# Patient Record
Sex: Female | Born: 1945 | Race: Black or African American | Hispanic: No | Smoking: Former smoker
Health system: Southern US, Community
[De-identification: ages and names within clinical notes are randomized; demographics above are authoritative.]

## PROBLEM LIST (undated history)

## (undated) DIAGNOSIS — J349 Unspecified disorder of nose and nasal sinuses: Secondary | ICD-10-CM

## (undated) DIAGNOSIS — K829 Disease of gallbladder, unspecified: Secondary | ICD-10-CM

## (undated) DIAGNOSIS — E119 Type 2 diabetes mellitus without complications: Secondary | ICD-10-CM

## (undated) DIAGNOSIS — R05 Cough: Secondary | ICD-10-CM

## (undated) DIAGNOSIS — R059 Cough, unspecified: Secondary | ICD-10-CM

## (undated) DIAGNOSIS — K219 Gastro-esophageal reflux disease without esophagitis: Secondary | ICD-10-CM

## (undated) DIAGNOSIS — R609 Edema, unspecified: Secondary | ICD-10-CM

## (undated) DIAGNOSIS — R197 Diarrhea, unspecified: Secondary | ICD-10-CM

## (undated) DIAGNOSIS — J45909 Unspecified asthma, uncomplicated: Secondary | ICD-10-CM

## (undated) DIAGNOSIS — M1990 Unspecified osteoarthritis, unspecified site: Secondary | ICD-10-CM

## (undated) DIAGNOSIS — I10 Essential (primary) hypertension: Secondary | ICD-10-CM

## (undated) DIAGNOSIS — M5134 Other intervertebral disc degeneration, thoracic region: Secondary | ICD-10-CM

## (undated) HISTORY — DX: Type 2 diabetes mellitus without complications: E11.9

## (undated) HISTORY — DX: Unspecified disorder of nose and nasal sinuses: J34.9

## (undated) HISTORY — DX: Disease of gallbladder, unspecified: K82.9

## (undated) HISTORY — PX: KNEE SURGERY: SHX244

## (undated) HISTORY — DX: Unspecified osteoarthritis, unspecified site: M19.90

## (undated) HISTORY — DX: Essential (primary) hypertension: I10

## (undated) HISTORY — PX: TOTAL ABDOMINAL HYSTERECTOMY: SHX209

## (undated) HISTORY — DX: Unspecified asthma, uncomplicated: J45.909

## (undated) HISTORY — DX: Gastro-esophageal reflux disease without esophagitis: K21.9

## (undated) HISTORY — PX: BACK SURGERY: SHX140

## (undated) HISTORY — DX: Edema, unspecified: R60.9

## (undated) HISTORY — DX: Diarrhea, unspecified: R19.7

## (undated) HISTORY — PX: SHOULDER SURGERY: SHX246

## (undated) HISTORY — DX: Cough: R05

## (undated) HISTORY — PX: OVARY SURGERY: SHX727

## (undated) HISTORY — PX: HAND SURGERY: SHX662

## (undated) HISTORY — DX: Other intervertebral disc degeneration, thoracic region: M51.34

## (undated) HISTORY — DX: Cough, unspecified: R05.9

## (undated) HISTORY — PX: FOOT SURGERY: SHX648

---
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
## 1970-10-08 HISTORY — PX: APPENDECTOMY: SHX54

## 1998-03-08 ENCOUNTER — Ambulatory Visit (HOSPITAL_COMMUNITY): Admission: RE | Admit: 1998-03-08 | Discharge: 1998-03-08 | Payer: Self-pay | Admitting: Neurosurgery

## 2002-10-08 HISTORY — PX: ANTERIOR CRUCIATE LIGAMENT REPAIR: SHX115

## 2004-10-08 HISTORY — PX: OTHER SURGICAL HISTORY: SHX169

## 2008-04-15 ENCOUNTER — Inpatient Hospital Stay (HOSPITAL_COMMUNITY): Admission: AD | Admit: 2008-04-15 | Discharge: 2008-04-16 | Payer: Self-pay | Admitting: Orthopedic Surgery

## 2008-07-30 ENCOUNTER — Ambulatory Visit (HOSPITAL_COMMUNITY): Admission: RE | Admit: 2008-07-30 | Discharge: 2008-07-31 | Payer: Self-pay | Admitting: Orthopedic Surgery

## 2009-09-29 ENCOUNTER — Emergency Department (HOSPITAL_COMMUNITY): Admission: EM | Admit: 2009-09-29 | Discharge: 2009-09-30 | Payer: Self-pay | Admitting: Emergency Medicine

## 2010-05-26 ENCOUNTER — Inpatient Hospital Stay (HOSPITAL_COMMUNITY): Admission: RE | Admit: 2010-05-26 | Discharge: 2010-06-01 | Payer: Self-pay | Admitting: Neurosurgery

## 2010-10-20 HISTORY — PX: COLONOSCOPY: SHX174

## 2010-10-29 ENCOUNTER — Encounter: Payer: Self-pay | Admitting: Orthopedic Surgery

## 2010-12-22 LAB — CBC
Hemoglobin: 11.6 g/dL — ABNORMAL LOW (ref 12.0–15.0)
MCHC: 31.6 g/dL (ref 30.0–36.0)
RDW: 14.7 % (ref 11.5–15.5)

## 2010-12-22 LAB — GLUCOSE, CAPILLARY
Glucose-Capillary: 100 mg/dL — ABNORMAL HIGH (ref 70–99)
Glucose-Capillary: 109 mg/dL — ABNORMAL HIGH (ref 70–99)
Glucose-Capillary: 124 mg/dL — ABNORMAL HIGH (ref 70–99)
Glucose-Capillary: 133 mg/dL — ABNORMAL HIGH (ref 70–99)
Glucose-Capillary: 134 mg/dL — ABNORMAL HIGH (ref 70–99)
Glucose-Capillary: 142 mg/dL — ABNORMAL HIGH (ref 70–99)
Glucose-Capillary: 146 mg/dL — ABNORMAL HIGH (ref 70–99)
Glucose-Capillary: 151 mg/dL — ABNORMAL HIGH (ref 70–99)
Glucose-Capillary: 151 mg/dL — ABNORMAL HIGH (ref 70–99)
Glucose-Capillary: 162 mg/dL — ABNORMAL HIGH (ref 70–99)
Glucose-Capillary: 46 mg/dL — ABNORMAL LOW (ref 70–99)
Glucose-Capillary: 81 mg/dL (ref 70–99)
Glucose-Capillary: 88 mg/dL (ref 70–99)
Glucose-Capillary: 97 mg/dL (ref 70–99)
Glucose-Capillary: 97 mg/dL (ref 70–99)

## 2010-12-22 LAB — BASIC METABOLIC PANEL
Calcium: 9.6 mg/dL (ref 8.4–10.5)
GFR calc Af Amer: >60 mL/min (ref >60)
GFR calc non Af Amer: >60 mL/min (ref >60)
Glucose, Bld: 101 mg/dL — ABNORMAL HIGH (ref 70–99)
Sodium: 144 mEq/L (ref 135–145)

## 2010-12-22 LAB — SURGICAL PCR SCREEN
MRSA, PCR: NEGATIVE
Staphylococcus aureus: NEGATIVE

## 2011-01-08 LAB — RAPID URINE DRUG SCREEN, HOSP PERFORMED
Amphetamines: NOT DETECTED
Benzodiazepines: POSITIVE — AB
Cocaine: NOT DETECTED
Tetrahydrocannabinol: NOT DETECTED

## 2011-01-08 LAB — DIFFERENTIAL
Basophils Absolute: 0.2 10*3/uL — ABNORMAL HIGH (ref 0.0–0.1)
Basophils Relative: 2 % — ABNORMAL HIGH (ref 0–1)
Eosinophils Relative: 2 % (ref 0–5)
Lymphocytes Relative: 28 % (ref 12–46)
Neutro Abs: 5.3 10*3/uL (ref 1.7–7.7)

## 2011-01-08 LAB — BASIC METABOLIC PANEL
BUN: 11 mg/dL (ref 6–23)
Calcium: 9.1 mg/dL (ref 8.4–10.5)
GFR calc non Af Amer: >60 mL/min (ref >60)
Glucose, Bld: 70 mg/dL (ref 70–99)

## 2011-01-08 LAB — URINALYSIS, ROUTINE W REFLEX MICROSCOPIC
Ketones, ur: NEGATIVE mg/dL
Nitrite: NEGATIVE
Protein, ur: NEGATIVE mg/dL

## 2011-01-08 LAB — PROTIME-INR
INR: 1.05 (ref 0.00–1.49)
Prothrombin Time: 13.6 seconds (ref 11.6–15.2)

## 2011-01-08 LAB — CBC
HCT: 35.2 % — ABNORMAL LOW (ref 36.0–46.0)
MCHC: 32.6 g/dL (ref 30.0–36.0)
Platelets: 208 10*3/uL (ref 150–400)
RDW: 13.9 % (ref 11.5–15.5)

## 2011-01-08 LAB — HEPATIC FUNCTION PANEL
Alkaline Phosphatase: 83 U/L (ref 39–117)
Bilirubin, Direct: <0.1 mg/dL (ref 0.0–0.3)
Total Bilirubin: 0.6 mg/dL (ref 0.3–1.2)

## 2011-01-08 LAB — ACETAMINOPHEN LEVEL: Acetaminophen (Tylenol), Serum: <10 ug/mL — ABNORMAL LOW (ref 10–30)

## 2011-02-20 NOTE — Op Note (Signed)
NAMEMARA, Frances Newman             ACCOUNT NO.:  [id number]   MEDICAL RECORD NO.:  [id number]          PATIENT TYPE:  AMB   LOCATION:  DAY                          FACILITY:  [facility]   PHYSICIAN:  Marlowe Kays, M.D.  DATE OF BIRTH:  Dec 31, 1945   DATE OF PROCEDURE:  04/14/2008  DATE OF DISCHARGE:                               OPERATIVE REPORT   PREOPERATIVE DIAGNOSES:  1. 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  2. Advanced osteoarthritis acromioclavicular joint.  3. Full-thickness retracted rotator cuff tear, left shoulder.   POSTOPERATIVE DIAGNOSES:  1. 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  2. Advanced osteoarthritis acromioclavicular joint.  3. Full-thickness retracted rotator cuff tear, left shoulder.   OPERATION:  1. Left shoulder arthroscopy with debridement of labrum, synovitis and      biceps tendon.  2. Open distal clavicle resection.  3. Anterior acromionectomy with repair of extensor rotator cuff tear.   SURGEON:  Marlowe Kays, M.D.   ASSISTANT:  Mr. Idolina Primer, [place].   ANESTHESIA:  Preoperative interscalene block and general anesthesia.   DESCRIPTION OF PROCEDURE:  Prophylactic antibiotics, satisfactory  general anesthesia, beach-chair position on the Allen frame, the left  shoulder was prepped with DuraPrep and draped in a sterile field.  Timeout performed.  Posterior arthroscopic portal, and incision for  distal clavicle resection, as well as the rotator cuff tear was marked  out.  Through a posterior soft spot portal, I atraumatically entered the  glenohumeral joint and found a large amount of synovitis, significant  labral disruption and general disarray which made initial evaluation of  the joint difficult until I was able to flush it out.  After reviewing  the pathology, I then advanced the scope between the biceps and  subscapularis.  The biceps was partially torn and using a swishing stick  and making  an for anterior incision, I used another cannula to enter the  joint, followed by a 4.2 shaver, debriding down the labrum, biceps  tendon and much of the synovitis.  She had full-thickness wear of the  glenoid and significant wear of the humeral head.  After completing this  portion of procedure, we left the camera mechanism intact, but I removed  all fluid possible from the joint.  I then made my open incision,  exposing the [facility] joint and distal clavicle.  She had significant  deformity of the [facility] joint, and I measured out a 1.5 cm medial to it,  undermined the clavicle at this point.  I then made an osteotomy with a  micro saw.  I resected the torn portion with towel clip and cautery  technique.  There were no remaining spicules of the apparent clavicle,  which I covered with bone wax.  I then extended my incision distally and  splitting the fascia in line with the skin incision over the anterior  acromion.  I then undermined the anterior acromion and performed my  initial anterior acromionectomy.  The extensor rotator cuff tear which  was upside down V with about 5 cm of retraction was identified.  Fortunately, it was flexible.  There  was some lamination of it.  I was  able to use a #2 Ethibond which had a small enough needle that I was  able to reapproximate the lamination at the apex of the V beneath the  acromion after removing a small amount more bone for exposure.  I placed  two such sutures and then placed a four strand Striker anchor at the  greater tuberosity, and then used the forced arms to reapproximate the  rotator cuff tendon side-to-side, giving a nice stable repair with her  arm to the side.  I took pictures of both the tear and the repair.  The  wound was then irrigated with sterile saline.  Gelfoam was placed in the  distal clavicle resection site, and the wound was then closed in layers  with interrupted #1 Vicryl in the fascia and deep subcutaneous tissue 2-  0 Vicryl,  superficial subcutaneous tissue and to assure adequate wound  closure, small staples in the skin with the two portals closed with 4-0  nylon.  Betadine Adaptic dry sterile dressing and shoulder immobilizer  were applied.  She tolerated the procedure and was taken to the recovery  room in satisfactory condition with no known complications.           ______________________________  Marlowe Kays, M.D.     JA/MEDQ  D:  04/14/2008  T:  04/14/2008  Job:  [id number]

## 2011-02-20 NOTE — Op Note (Signed)
Frances Newman, Frances Newman             ACCOUNT NO.:  [id number]   MEDICAL RECORD NO.:  [id number]          PATIENT TYPE:  AMB   LOCATION:  DAY                          FACILITY:  [facility]   PHYSICIAN:  Marlowe Kays, M.D.  DATE OF BIRTH:  1946-08-17   DATE OF PROCEDURE:  07/30/2008  DATE OF DISCHARGE:                               OPERATIVE REPORT   PREOPERATIVE DIAGNOSIS:  Recurrent rotator cuff tear, left shoulder.   POSTOPERATIVE DIAGNOSIS:  Recurrent rotator cuff tear, left shoulder.   OPERATION:  Anterior acromionectomy and repair of recurrent rotator cuff  tear, left shoulder.   SURGEON:  Marlowe Kays, M.D.   ASSISTANT:  Nurse.   ANESTHESIA:  General and preceded by interscalene block.   PATHOLOGY AND JUSTIFICATION FOR PROCEDURE:  I repaired a significant  rotator cuff tear on April 14, 2008.  She was somewhat less than 12 weeks  post surgery which is the demarcation time we use for any major  strengthening or stress on the rotator cuff repair and because she was  comfortable she lifted a heavy water jug and had a sudden pain in the  shoulder leading to an MRI demonstrating a full-thickness rotator cuff  tear which on MRI measured 2.4 cm x 2.9.  Because of the pain and the  documentation of the tear she is here today for additional surgery.   PROCEDURE:  Prophylactic antibiotics.  Interscalene block by  anesthesiologist, beach-chair position on the Allen frame, left shoulder  girdle was prepped with DuraPrep, draped in sterile field.  Time-out  performed.  Ioban employed.  I went through the old surgical incision  and with cutting cautery dissected off the fascia over, the residual  anterior acromion and split the deltoid for a centimeter or so.  Joint  fluid immediately came forth confirming the tear.  The four strands of  the rotator cuff anchor previously used were immediately apparent and  these were cut and removed.  The size of her tear was exactly as  depicted on the  MRI with a vertical type posturing and a more prominent  and flexible posterior flap as opposed to an anterior flap.  The tear  went way up beneath the acromion.  Accordingly, I removed additional  acromion with a rongeur for both exposure and to ease impingement.  After assessing the pathology of the tear I once again used a four  stranded Stryker rotator cuff anchor, placing it laterally near the  greater tuberosity and then initially used two of the strands for one  tie adjacent to the greater tuberosity to bring the two flaps together  then followed this with the other two above this.  This stabilized the  tear laterally.  I then used the remainder of these strands with  individual side-to-side type sutures which brought the two flaps  together nicely way up beneath the anterior acromion.  She had a nice  stable repair with her arms at her side and I did not feel that she  needed a tissue graft.  The wound was irrigated with sterile saline.  The wound was closed with interrupted #1  Vicryl in the fascia over the  anterior acromion and in the incision at the deltoid.  A combination  of zero and 2-0 Vicryl in subcutaneous tissues and Steri-Strips on the  skin.  Dry sterile dressing and shoulder immobilizer applied.  She  tolerated the procedure well was taken to the recovery room in  satisfactory condition with no known complications and minimal blood  loss.           ______________________________  Marlowe Kays, M.D.     JA/MEDQ  D:  07/30/2008  T:  07/30/2008  Job:  [id number]

## 2011-07-05 LAB — HEMOGLOBIN AND HEMATOCRIT, BLOOD: HCT: 37.5

## 2011-07-05 LAB — BASIC METABOLIC PANEL
CO2: 30
Calcium: 9.8
Chloride: 102
Creatinine, Ser: 0.79
Glucose, Bld: 116 — ABNORMAL HIGH

## 2011-07-10 LAB — URINALYSIS, ROUTINE W REFLEX MICROSCOPIC
Bilirubin Urine: NEGATIVE
Glucose, UA: NEGATIVE
Ketones, ur: NEGATIVE
Nitrite: NEGATIVE
Protein, ur: NEGATIVE
Urobilinogen, UA: 0.2
pH: 6.5

## 2011-07-10 LAB — GLUCOSE, CAPILLARY
Glucose-Capillary: 68 — ABNORMAL LOW
Glucose-Capillary: 70
Glucose-Capillary: 79

## 2011-07-10 LAB — HEMOGLOBIN AND HEMATOCRIT, BLOOD
HCT: 34.7 — ABNORMAL LOW
Hemoglobin: 11.3 — ABNORMAL LOW

## 2011-07-10 LAB — BASIC METABOLIC PANEL
BUN: 6
CO2: 25
Chloride: 109
Creatinine, Ser: 0.61
Potassium: 4.1

## 2013-08-25 ENCOUNTER — Ambulatory Visit: Payer: Self-pay | Admitting: Podiatrist

## 2013-10-07 ENCOUNTER — Encounter: Payer: Self-pay | Admitting: Podiatrist

## 2013-10-08 HISTORY — PX: CHOLECYSTECTOMY: SHX55

## 2013-10-13 ENCOUNTER — Ambulatory Visit (INDEPENDENT_AMBULATORY_CARE_PROVIDER_SITE_OTHER): Payer: PRIVATE HEALTH INSURANCE | Admitting: Podiatrist

## 2013-10-13 ENCOUNTER — Encounter: Payer: Self-pay | Admitting: Podiatrist

## 2013-10-13 VITALS — BP 122/69 | HR 76 | Resp 18

## 2013-10-13 DIAGNOSIS — M79609 Pain in unspecified limb: Secondary | ICD-10-CM

## 2013-10-13 DIAGNOSIS — B351 Tinea unguium: Secondary | ICD-10-CM

## 2013-10-13 NOTE — Progress Notes (Signed)
  HPI:  Patient presents today for follow up of foot and nail care. Denies any new complaints today.  Objective:  Patients chart is reviewed.  Neurovascular status unchanged.  Patients nails are thickened, discolored, distrophic, friable and brittle with yellow-brown discoloration. Patient subjectively relates they are painful with shoes and with ambulation of bilateral feet. 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  The symptomatic toenails and corns were debrided through manual an mechanical means without complication.  Return appointment recommended at routine intervals of 3 months    Frances AschoffKathryn Hasna Newman, DPM

## 2013-10-13 NOTE — Patient Instructions (Signed)
GENERAL FOOT HEALTH INFORMATION:  Moisturize your feet regularly with a cream based lotion such as Cetaphil (Cream) or Eucerin (Cream)- usually available in a tub or crock type of container.  Avoid applying the cream to the toe interspaces themselves to reduce the risk of a fungal infection between the toes.  After showering or bathing be sure to dry well between your toes.    Avoid prolonged use of thin ballet type flats, or flip flops.  Everyday use of these shoes can actually cause foot problems or injuries.  Watch your toenails for any signs of infection including drainage, pus redness or swelling along the sides of the toenails.  Soak in epsom salt water and use antibiotic ointment (OTC) if you notice this start to occur.  If the redness does not resolve within 2-3 days, call for an appointment to be seen.

## 2013-12-08 ENCOUNTER — Encounter: Payer: Self-pay | Admitting: Podiatrist

## 2013-12-08 ENCOUNTER — Ambulatory Visit (INDEPENDENT_AMBULATORY_CARE_PROVIDER_SITE_OTHER): Payer: PRIVATE HEALTH INSURANCE | Admitting: Podiatrist

## 2013-12-08 VITALS — BP 90/59 | HR 95 | Resp 18

## 2013-12-08 DIAGNOSIS — L84 Corns and callosities: Secondary | ICD-10-CM

## 2013-12-08 DIAGNOSIS — M7150 Other bursitis, not elsewhere classified, unspecified site: Secondary | ICD-10-CM

## 2013-12-08 DIAGNOSIS — M2040 Other hammer toe(s) (acquired), unspecified foot: Secondary | ICD-10-CM

## 2013-12-08 NOTE — Progress Notes (Signed)
   Chief Complaint  Patient presents with  . Foot Problem    3rd toe on right foot     HPI: Patient is 68 y.o. female who presents today for pain on the right third toe. Patient states "I want her to look at this 3rd toe on my right foot and no burning and some throbbing going up leg and sore and tender and there is no draining and aches"  she denies any trauma or injury to the foot.     Physical Exam  GENERAL APPEARANCE: Alert, conversant. Appropriately groomed. No acute distress.  VASCULAR: Pedal pulses palpable and strong bilateral at 2/4 DP and PT.  Capillary refill time is immediate to all digits,  Proximal to distal cooling it warm to warm.  Digital hair growth is present bilateral  NEUROLOGIC: sensation is intact epicritically and protectively to 5.07 monofilament at 5/5 sites bilateral.  Light touch is intact bilateral, vibratory sensation intact bilateral, achilles tendon reflex is intact bilateral.  MUSCULOSKELETAL: Hammertoe contracture present third digit right. Mild contracture of the lesser digits is also noted that the third digit on the right is the most symptomatic and painful.  DERMATOLOGIC: skin color, texture, and turger are within normal limits.  Swelling at the third digit right is present consistent with her pain. She has a hard lesion which is hyperkeratotic in nature at the distal tip of the right third toe.  Assessment: Hammertoe contracture, corn, bursitis  Plan: Recommended an injection of a local steroid to help with the swelling was carried out under sterile technique utilizing dexamethasone and Marcaine plain without complication.. I also debrided the corn with a 15 blade without complication. Recommended a buttress pad for offloading. She'll be seen back when necessary  Kazuo Durnil P

## 2014-02-09 ENCOUNTER — Ambulatory Visit: Payer: PRIVATE HEALTH INSURANCE | Admitting: Podiatrist

## 2014-02-23 ENCOUNTER — Encounter: Payer: Self-pay | Admitting: Podiatrist

## 2014-02-23 ENCOUNTER — Ambulatory Visit (INDEPENDENT_AMBULATORY_CARE_PROVIDER_SITE_OTHER): Payer: PRIVATE HEALTH INSURANCE | Admitting: Podiatrist

## 2014-02-23 VITALS — BP 115/73 | HR 100 | Resp 18

## 2014-02-23 DIAGNOSIS — L84 Corns and callosities: Secondary | ICD-10-CM

## 2014-02-23 DIAGNOSIS — M2040 Other hammer toe(s) (acquired), unspecified foot: Secondary | ICD-10-CM

## 2014-02-23 DIAGNOSIS — B353 Tinea pedis: Secondary | ICD-10-CM

## 2014-02-23 DIAGNOSIS — M79609 Pain in unspecified limb: Secondary | ICD-10-CM

## 2014-02-23 DIAGNOSIS — B351 Tinea unguium: Secondary | ICD-10-CM

## 2014-02-23 MED ORDER — DICLOFENAC SODIUM 1 % TD GEL: 2.0000 g | Freq: Four times a day (QID) | TRANSDERMAL | Status: DC

## 2014-02-23 MED ORDER — DIAZEPAM 2 MG PO TABS: 2.0000 mg | ORAL_TABLET | Freq: Every evening | ORAL | Status: DC | PRN

## 2014-02-23 MED ORDER — NAFTIFINE HCL 2 % EX CREA: 1.0000 "application " | TOPICAL_CREAM | CUTANEOUS | Status: DC

## 2014-02-23 NOTE — Patient Instructions (Signed)
Use the voltaren gel (rx has been sent in-- if its too expensive, call and I can try and substitute it out)  I will call in a rx for the itching and valium for your night cramps-- try it for a week to see if it helps.

## 2014-02-23 NOTE — Progress Notes (Signed)
I need my toenails trimmed up and a corn on my 3rd toe on my left foot needs to be cut and I have pain on my right foot but I have not done anything to it   Objective:  VASCULAR: Pedal pulses palpable and strong bilateral at 2/4 DP and PT. Capillary refill time is immediate to all digits, Proximal to distal cooling it warm to warm. Digital hair growth is present bilateral  NEUROLOGIC: sensation is intact epicritically and protectively to 5.07 monofilament at 5/5 sites bilateral. Light touch is intact bilateral, vibratory sensation intact bilateral, achilles tendon reflex is intact bilateral.  MUSCULOSKELETAL: Hammertoe contracture present third digit right. Mild contracture of the lesser digits is also noted that the third digit on the right is the most symptomatic and painful.  Pain on the dorsal right midfoot is also present.   DERMATOLOGIC: skin color, texture, and turger are within normal limits. Swelling at the third digit right is present consistent with her pain. She has a hard lesion which is hyperkeratotic in nature at the distal tip of the right third toe. Digital nails 2-5 are symptomatic and painful bilateral. They are mycotic and thickened.    Assessment: Hammertoe contracture, corn, bursitis, mycotic toenails with pain  Plan:  Debridement of nails and lesions carried out.  Recommended naftin cream, voltaren gel and valium as she is experiencing cramping as well.  She will be seen back in 3 months for routine care.

## 2014-06-22 ENCOUNTER — Encounter: Payer: Self-pay | Admitting: Podiatrist

## 2014-06-22 ENCOUNTER — Ambulatory Visit (INDEPENDENT_AMBULATORY_CARE_PROVIDER_SITE_OTHER): Payer: PRIVATE HEALTH INSURANCE | Admitting: Podiatrist

## 2014-06-22 VITALS — BP 136/79 | HR 105 | Resp 18

## 2014-06-22 DIAGNOSIS — E1140 Type 2 diabetes mellitus with diabetic neuropathy, unspecified: Secondary | ICD-10-CM

## 2014-06-22 DIAGNOSIS — B351 Tinea unguium: Secondary | ICD-10-CM

## 2014-06-22 DIAGNOSIS — E1149 Type 2 diabetes mellitus with other diabetic neurological complication: Secondary | ICD-10-CM

## 2014-06-22 DIAGNOSIS — Q828 Other specified congenital malformations of skin: Secondary | ICD-10-CM

## 2014-06-22 DIAGNOSIS — M216X9 Other acquired deformities of unspecified foot: Secondary | ICD-10-CM

## 2014-06-22 DIAGNOSIS — M79676 Pain in unspecified toe(s): Principal | ICD-10-CM

## 2014-06-22 DIAGNOSIS — M79609 Pain in unspecified limb: Secondary | ICD-10-CM

## 2014-06-22 NOTE — Patient Instructions (Signed)
Diabetes and Foot Care Diabetes may cause you to have problems because of poor blood supply (circulation) to your feet and legs. This may cause the skin on your feet to become thinner, break easier, and heal more slowly. Your skin may become dry, and the skin may peel and crack. You may also have nerve damage in your legs and feet causing decreased feeling in them. You may not notice minor injuries to your feet that could lead to infections or more serious problems. Taking care of your feet is one of the most important things you can do for yourself.  HOME CARE INSTRUCTIONS  Wear shoes at all times, even in the house. Do not go barefoot. Bare feet are easily injured.  Check your feet daily for blisters, cuts, and redness. If you cannot see the bottom of your feet, use a mirror or ask someone for help.  Wash your feet with warm water (do not use hot water) and mild soap. Then pat your feet and the areas between your toes until they are completely dry. Do not soak your feet as this can dry your skin.  Apply a moisturizing lotion or petroleum jelly (that does not contain alcohol and is unscented) to the skin on your feet and to dry, brittle toenails. Do not apply lotion between your toes.  Trim your toenails straight across. Do not dig under them or around the cuticle. File the edges of your nails with an emery board or nail file.  Do not cut corns or calluses or try to remove them with medicine.  Wear clean socks or stockings every day. Make sure they are not too tight. Do not wear knee-high stockings since they may decrease blood flow to your legs.  Wear shoes that fit properly and have enough cushioning. To break in new shoes, wear them for just a few hours a day. This prevents you from injuring your feet. Always look in your shoes before you put them on to be sure there are no objects inside.  Do not cross your legs. This may decrease the blood flow to your feet.  If you find a minor scrape,  cut, or break in the skin on your feet, keep it and the skin around it clean and dry. These areas may be cleansed with mild soap and water. Do not cleanse the area with peroxide, alcohol, or iodine.  When you remove an adhesive bandage, be sure not to damage the skin around it.  If you have a wound, look at it several times a day to make sure it is healing.  Do not use heating pads or hot water bottles. They may burn your skin. If you have lost feeling in your feet or legs, you may not know it is happening until it is too late.  Make sure your health care provider performs a complete foot exam at least annually or more often if you have foot problems. Report any cuts, sores, or bruises to your health care provider immediately. SEEK MEDICAL CARE IF:   You have an injury that is not healing.  You have cuts or breaks in the skin.  You have an ingrown nail.  You notice redness on your legs or feet.  You feel burning or tingling in your legs or feet.  You have pain or cramps in your legs and feet.  Your legs or feet are numb.  Your feet always feel cold. SEEK IMMEDIATE MEDICAL CARE IF:   There is increasing redness,   swelling, or pain in or around a wound.  There is a red line that goes up your leg.  Pus is coming from a wound.  You develop a fever or as directed by your health care provider.  You notice a bad smell coming from an ulcer or wound. Document Released: 09/21/2000 Document Revised: 05/27/2013 Document Reviewed: 03/03/2013 Exit[employer] Patient Information 2015 [employer]. This information is not intended to replace advice given to you by your health care provider. Make sure you discuss any questions you have with your health care provider.  

## 2014-06-29 NOTE — Progress Notes (Signed)
Subjective:  Patient presents for continued diabetic foot and nail care. She relates painful toenails and calluses.  Objective:  VASCULAR: Pedal pulses palpable and strong bilateral at 2/4 DP and PT. Capillary refill time is immediate to all digits, Proximal to distal cooling it warm to warm. Digital hair growth is present bilateral  NEUROLOGIC: sensation is intact epicritically and protectively to 5.07 monofilament at 5/5 sites bilateral. Light touch is intact bilateral, vibratory sensation intact bilateral, achilles tendon reflex is intact bilateral.  MUSCULOSKELETAL: Hammertoe contracture present third digit right. Mild contracture of the lesser digits is also noted that the third digit on the right is the most symptomatic and painful. Pain on the dorsal right midfoot is also present.  DERMATOLOGIC: skin color, texture, and turger are within normal limits. Swelling at the third digit right is present consistent with her pain. She has a hard lesion which is hyperkeratotic in nature at the distal tip of the right third toe. Digital nails 2-5 are symptomatic and painful bilateral. They are mycotic and thickened.   Assessment: Hammertoe contracture, corn, bursitis, mycotic toenails with pain  Plan: Debridement of nails and lesions carried out.  She will be seen back in 3 months for routine care

## 2014-08-10 ENCOUNTER — Ambulatory Visit (INDEPENDENT_AMBULATORY_CARE_PROVIDER_SITE_OTHER): Payer: PRIVATE HEALTH INSURANCE | Admitting: Podiatrist

## 2014-08-10 ENCOUNTER — Ambulatory Visit (INDEPENDENT_AMBULATORY_CARE_PROVIDER_SITE_OTHER): Payer: PRIVATE HEALTH INSURANCE

## 2014-08-10 ENCOUNTER — Encounter: Payer: Self-pay | Admitting: Podiatrist

## 2014-08-10 VITALS — BP 128/88 | HR 64 | Resp 12

## 2014-08-10 DIAGNOSIS — M84374A Stress fracture, right foot, initial encounter for fracture: Secondary | ICD-10-CM

## 2014-08-10 DIAGNOSIS — M6588 Other synovitis and tenosynovitis, other site: Secondary | ICD-10-CM

## 2014-08-10 DIAGNOSIS — M7750 Other enthesopathy of unspecified foot: Secondary | ICD-10-CM

## 2014-08-10 MED ORDER — TRIAMCINOLONE ACETONIDE 10 MG/ML IJ SUSP
10.0000 mg | Freq: Once | INTRAMUSCULAR | Status: AC
  Administered 2014-08-10: 10 mg

## 2014-08-10 NOTE — Progress Notes (Signed)
   Subjective: Frances Newman presents today for continued pain in the right foot. She points to the dorsal central and metatarsal heads 2, 3, 4 and states she's having pain. She's been trying to wear a compressive copper sock and relates no improvement.  Objective:  Neurovascular status is intact and unchanged the right foot. MUSCULOSKELETAL: Pain on the dorsal central aspect of the right foot and at the metatarsal heads 2, 3, 4 are noted.Hammertoe contracture present third digit right. Mild contracture of the lesser digits is also noted that the third the tarsalon the right is the most symptomatic and painful.   X-rays taken today which show arthritic changes. Hammertoe contractures also present. No sign of fracture or stress fracture  Assessment: Arthritis right foot  Plan: Injected the midfoot area with Kenalog and Marcaine plain at the area of most discomfort. Recommended continued compression and anti-inflammatory medications. Be seen back for her routine care appointment and if has any problems prior to that visit she will call

## 2014-08-10 NOTE — Patient Instructions (Signed)
Arthritis, Nonspecific °Arthritis is inflammation of a joint. This usually means pain, redness, warmth or swelling are present. One or more joints may be involved. There are a number of types of arthritis. Your caregiver may not be able to tell what type of arthritis you have right away. °CAUSES  °The most common cause of arthritis is the wear and tear on the joint (osteoarthritis). This causes damage to the cartilage, which can break down over time. The knees, hips, back and neck are most often affected by this type of arthritis. °Other types of arthritis and common causes of joint pain include: °· Sprains and other injuries near the joint. Sometimes minor sprains and injuries cause pain and swelling that develop hours later. °· Rheumatoid arthritis. This affects hands, feet and knees. It usually affects both sides of your body at the same time. It is often associated with chronic ailments, fever, weight loss and general weakness. °· Crystal arthritis. Gout and pseudo gout can cause occasional acute severe pain, redness and swelling in the foot, ankle, or knee. °· Infectious arthritis. Bacteria can get into a joint through a break in overlying skin. This can cause infection of the joint. Bacteria and viruses can also spread through the blood and affect your joints. °· Drug, infectious and allergy reactions. Sometimes joints can become mildly painful and slightly swollen with these types of illnesses. °SYMPTOMS  °· Pain is the main symptom. °· Your joint or joints can also be red, swollen and warm or hot to the touch. °· You may have a fever with certain types of arthritis, or even feel overall ill. °· The joint with arthritis will hurt with movement. Stiffness is present with some types of arthritis. °DIAGNOSIS  °Your caregiver will suspect arthritis based on your description of your symptoms and on your exam. Testing may be needed to find the type of arthritis: °· Blood and sometimes urine tests. °· X-ray tests  and sometimes CT or MRI scans. °· Removal of fluid from the joint (arthrocentesis) is done to check for bacteria, crystals or other causes. Your caregiver (or a specialist) will numb the area over the joint with a local anesthetic, and use a needle to remove joint fluid for examination. This procedure is only minimally uncomfortable. °· Even with these tests, your caregiver may not be able to tell what kind of arthritis you have. Consultation with a specialist (rheumatologist) may be helpful. °TREATMENT  °Your caregiver will discuss with you treatment specific to your type of arthritis. If the specific type cannot be determined, then the following general recommendations may apply. °Treatment of severe joint pain includes: °· Rest. °· Elevation. °· Anti-inflammatory medication (for example, ibuprofen) may be prescribed. Avoiding activities that cause increased pain. °· Only take over-the-counter or prescription medicines for pain and discomfort as recommended by your caregiver. °· Cold packs over an inflamed joint may be used for 10 to 15 minutes every hour. Hot packs sometimes feel better, but do not use overnight. Do not use hot packs if you are diabetic without your caregiver's permission. °· A cortisone shot into arthritic joints may help reduce pain and swelling. °· Any acute arthritis that gets worse over the next 1 to 2 days needs to be looked at to be sure there is no joint infection. °Long-term arthritis treatment involves modifying activities and lifestyle to reduce joint stress jarring. This can include weight loss. Also, exercise is needed to nourish the joint cartilage and remove waste. This helps keep the muscles   around the joint strong. °HOME CARE INSTRUCTIONS  °· Do not take aspirin to relieve pain if gout is suspected. This elevates uric acid levels. °· Only take over-the-counter or prescription medicines for pain, discomfort or fever as directed by your caregiver. °· Rest the joint as much as  possible. °· If your joint is swollen, keep it elevated. °· Use crutches if the painful joint is in your leg. °· Drinking plenty of fluids may help for certain types of arthritis. °· Follow your caregiver's dietary instructions. °· Try low-impact exercise such as: °¨ Swimming. °¨ Water aerobics. °¨ Biking. °¨ Walking. °· Morning stiffness is often relieved by a warm shower. °· Put your joints through regular range-of-motion. °SEEK MEDICAL CARE IF:  °· You do not feel better in 24 hours or are getting worse. °· You have side effects to medications, or are not getting better with treatment. °SEEK IMMEDIATE MEDICAL CARE IF:  °· You have a fever. °· You develop severe joint pain, swelling or redness. °· Many joints are involved and become painful and swollen. °· There is severe back pain and/or leg weakness. °· You have loss of bowel or bladder control. °Document Released: 11/01/2004 Document Revised: 12/17/2011 Document Reviewed: 11/17/2008 °ExitCare® Patient Information ©2015 [employer]. This information is not intended to replace advice given to you by your health care provider. Make sure you discuss any questions you have with your health care provider. ° °

## 2014-09-20 ENCOUNTER — Ambulatory Visit (INDEPENDENT_AMBULATORY_CARE_PROVIDER_SITE_OTHER): Payer: PRIVATE HEALTH INSURANCE

## 2014-09-20 VITALS — BP 142/88 | HR 62 | Resp 12

## 2014-09-20 DIAGNOSIS — M216X9 Other acquired deformities of unspecified foot: Secondary | ICD-10-CM

## 2014-09-20 DIAGNOSIS — E1140 Type 2 diabetes mellitus with diabetic neuropathy, unspecified: Secondary | ICD-10-CM

## 2014-09-20 DIAGNOSIS — B351 Tinea unguium: Secondary | ICD-10-CM

## 2014-09-20 DIAGNOSIS — E1141 Type 2 diabetes mellitus with diabetic mononeuropathy: Secondary | ICD-10-CM

## 2014-09-20 DIAGNOSIS — Q828 Other specified congenital malformations of skin: Secondary | ICD-10-CM

## 2014-09-20 DIAGNOSIS — M79673 Pain in unspecified foot: Secondary | ICD-10-CM

## 2014-09-20 NOTE — Patient Instructions (Signed)
Diabetes and Foot Care Diabetes may cause you to have problems because of poor blood supply (circulation) to your feet and legs. This may cause the skin on your feet to become thinner, break easier, and heal more slowly. Your skin may become dry, and the skin may peel and crack. You may also have nerve damage in your legs and feet causing decreased feeling in them. You may not notice minor injuries to your feet that could lead to infections or more serious problems. Taking care of your feet is one of the most important things you can do for yourself.  HOME CARE INSTRUCTIONS  Wear shoes at all times, even in the house. Do not go barefoot. Bare feet are easily injured.  Check your feet daily for blisters, cuts, and redness. If you cannot see the bottom of your feet, use a mirror or ask someone for help.  Wash your feet with warm water (do not use hot water) and mild soap. Then pat your feet and the areas between your toes until they are completely dry. Do not soak your feet as this can dry your skin.  Apply a moisturizing lotion or petroleum jelly (that does not contain alcohol and is unscented) to the skin on your feet and to dry, brittle toenails. Do not apply lotion between your toes.  Trim your toenails straight across. Do not dig under them or around the cuticle. File the edges of your nails with an emery board or nail file.  Do not cut corns or calluses or try to remove them with medicine.  Wear clean socks or stockings every day. Make sure they are not too tight. Do not wear knee-high stockings since they may decrease blood flow to your legs.  Wear shoes that fit properly and have enough cushioning. To break in new shoes, wear them for just a few hours a day. This prevents you from injuring your feet. Always look in your shoes before you put them on to be sure there are no objects inside.  Do not cross your legs. This may decrease the blood flow to your feet.  If you find a minor scrape,  cut, or break in the skin on your feet, keep it and the skin around it clean and dry. These areas may be cleansed with mild soap and water. Do not cleanse the area with peroxide, alcohol, or iodine.  When you remove an adhesive bandage, be sure not to damage the skin around it.  If you have a wound, look at it several times a day to make sure it is healing.  Do not use heating pads or hot water bottles. They may burn your skin. If you have lost feeling in your feet or legs, you may not know it is happening until it is too late.  Make sure your health care provider performs a complete foot exam at least annually or more often if you have foot problems. Report any cuts, sores, or bruises to your health care provider immediately. SEEK MEDICAL CARE IF:   You have an injury that is not healing.  You have cuts or breaks in the skin.  You have an ingrown nail.  You notice redness on your legs or feet.  You feel burning or tingling in your legs or feet.  You have pain or cramps in your legs and feet.  Your legs or feet are numb.  Your feet always feel cold. SEEK IMMEDIATE MEDICAL CARE IF:   There is increasing redness,   swelling, or pain in or around a wound.  There is a red line that goes up your leg.  Pus is coming from a wound.  You develop a fever or as directed by your health care provider.  You notice a bad smell coming from an ulcer or wound. Document Released: 09/21/2000 Document Revised: 05/27/2013 Document Reviewed: 03/03/2013 [facility]Exit[facility] Patient Information 2015 [place], [place]. This information is not intended to replace advice given to you by your health care provider. Make sure you discuss any questions you have with your health care provider.   Recommendation is to wear socks and shoes at all times.  As far as pain of the right foot.  This is associated with arthritis of and bone spurring on top of the foot as well as diabetes with neuropathy. Recommendation at this time  is to use a topical pain cream such as Aspercreme or salon pos, or Biofreeze apply 2 or 3 times every day to the top of the foot. Patient also apparently has gabapentin at home that she is not been using regularly my recommendation is to use a gabapentin every day at bedtime only either 1 or 2 tablets at bedtime as instructed. The neuropathy is likely to continue and will only respond are likely resolve symptomology with prolonged or steady use of gabapentin.

## 2014-09-20 NOTE — Progress Notes (Signed)
   Subjective:    Patient ID: Shellia CarwinBrenda K Rumer, female    DOB: 03/22/1946, 68 y.o.   MRN: [id number]  HPI  TRIM MY TOENAILS.  Review of Systems no new findings or systemic changes noted     Objective:   Physical Exam   68 year old African female options this time for follow-up and diabetic foot and nail care nails thick brittle Crumley friable dystrophic 1 through 5 bilateral also is keratoses HD 2 and 3 of the third toe left has Hemorrhage a keratoses which is debrided away at this time due to hammertoe deformity digital contracture patient also has pain dorsum of the right foot history possible old stress fracture or dorsal exostosis with bony metatarsal prominence at this time patient advised to continue with topical cream. Patient has been on gabapentin in the past however has not taken it consistently is not on her medications list that she does have an at home.. There is pain on palpation of the hammertoe deformity left foot also thick brittle Crumley friable dystrophic nails 1 through 5 bilateral which required debridement at this time.      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 This time painful mycotic nails 1 through 5 bilateral debrided still keratotic lesion HD 3 left is debrided recommend topical antifungal pain medications for dorsum of the foot patient's had previous steroid injection which provided temporary relief go back to using gabapentin every evening as instructed reevaluate in 3 months for continued palliative nail care and assessment of neuropathy.  Alvan Dameichard Zaine Elsass DPM

## 2014-12-20 ENCOUNTER — Ambulatory Visit: Payer: PRIVATE HEALTH INSURANCE

## 2015-04-25 ENCOUNTER — Encounter: Payer: Self-pay | Admitting: Podiatry

## 2015-04-25 ENCOUNTER — Ambulatory Visit (INDEPENDENT_AMBULATORY_CARE_PROVIDER_SITE_OTHER): Payer: Medicare Other

## 2015-04-25 ENCOUNTER — Ambulatory Visit (INDEPENDENT_AMBULATORY_CARE_PROVIDER_SITE_OTHER): Payer: Medicare Other | Admitting: Podiatry

## 2015-04-25 VITALS — BP 147/86 | HR 102 | Resp 18

## 2015-04-25 DIAGNOSIS — M79671 Pain in right foot: Secondary | ICD-10-CM

## 2015-04-25 DIAGNOSIS — M2042 Other hammer toe(s) (acquired), left foot: Secondary | ICD-10-CM

## 2015-04-25 DIAGNOSIS — G5791 Unspecified mononeuropathy of right lower limb: Secondary | ICD-10-CM

## 2015-04-25 DIAGNOSIS — B351 Tinea unguium: Secondary | ICD-10-CM | POA: Diagnosis not present

## 2015-04-25 DIAGNOSIS — M19079 Primary osteoarthritis, unspecified ankle and foot: Secondary | ICD-10-CM

## 2015-04-25 DIAGNOSIS — M129 Arthropathy, unspecified: Secondary | ICD-10-CM

## 2015-04-25 DIAGNOSIS — M79672 Pain in left foot: Secondary | ICD-10-CM

## 2015-04-25 NOTE — Progress Notes (Signed)
Subjective:     Patient ID: Frances Newman, female   DOB: 03/18/1946, 69 y.o.   MRN: [id number]  HPIThis patient presents to my office for preventive foot care services.  Her nails and hammer toes need to be treated.  She also says she has pain over the top of her right foot when she wears her shoes.  She says her foot swells and she ends up with pain through her whole foot.   Review of Systems     Objective:   Physical Exam GENERAL APPEARANCE: Alert, conversant. Appropriately groomed. No acute distress.  VASCULAR: Pedal pulses palpable at 2/4 DP and PT bilateral.  Capillary refill time is immediate to all digits,  Proximal to distal cooling it warm to warm.  Digital hair growth is present bilateral  NEUROLOGIC: sensation is intact epicritically and protectively to 5.07 monofilament at 5/5 sites bilateral.  Light touch is intact bilateral, vibratory sensation intact bilateral, achilles tendon reflex is intact bilateral.  MUSCULOSKELETAL: acceptable muscle strength, tone and stability bilateral.  Intrinsic muscluature intact bilateral.  Rectus appearance of foot and digits noted bilateral. Hammer toes 2.3 left foot.  Midfoot bony prominence with inflamed nerve right foot.  DERMATOLOGIC: skin color, texture, and turgor are within normal limits.  No preulcerative lesions are seen, no interdigital maceration noted.  No open lesions present.  . No drainage notedNails Thick disfigured discolored nails 2-5 B/L.  Heloma Durum third left foot.      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     Plan:     Debride nails and corns.  X-ray taken.  Discussed neuritis with patient.  Told her to use voltaren gel at site of nerve pain.  Lacing instructions.

## 2015-11-02 ENCOUNTER — Encounter: Payer: Self-pay | Admitting: Sports Medicine

## 2015-11-02 ENCOUNTER — Ambulatory Visit (INDEPENDENT_AMBULATORY_CARE_PROVIDER_SITE_OTHER): Payer: Medicare Other | Admitting: Sports Medicine

## 2015-11-02 ENCOUNTER — Ambulatory Visit (INDEPENDENT_AMBULATORY_CARE_PROVIDER_SITE_OTHER): Payer: Medicare Other

## 2015-11-02 DIAGNOSIS — M2141 Flat foot [pes planus] (acquired), right foot: Secondary | ICD-10-CM | POA: Diagnosis not present

## 2015-11-02 DIAGNOSIS — M79672 Pain in left foot: Secondary | ICD-10-CM

## 2015-11-02 DIAGNOSIS — M19079 Primary osteoarthritis, unspecified ankle and foot: Secondary | ICD-10-CM | POA: Diagnosis not present

## 2015-11-02 DIAGNOSIS — M21619 Bunion of unspecified foot: Secondary | ICD-10-CM

## 2015-11-02 DIAGNOSIS — M2142 Flat foot [pes planus] (acquired), left foot: Secondary | ICD-10-CM | POA: Diagnosis not present

## 2015-11-02 DIAGNOSIS — M779 Enthesopathy, unspecified: Secondary | ICD-10-CM

## 2015-11-02 DIAGNOSIS — M2040 Other hammer toe(s) (acquired), unspecified foot: Secondary | ICD-10-CM

## 2015-11-02 DIAGNOSIS — M79671 Pain in right foot: Secondary | ICD-10-CM

## 2015-11-02 MED ORDER — METHYLPREDNISOLONE 4 MG PO TBPK: ORAL_TABLET | ORAL | Status: DC

## 2015-11-03 NOTE — Progress Notes (Signed)
Patient ID: Frances Newman, female   DOB: 12-20-1945, 70 y.o.   MRN: [id number] Subjective: Frances Newman is a 70 y.o. diabetic female patient who presents to office for evaluation of Left>Right foot pain. Patient states that both of her feet hurt all over the top and the bottoms states that left foot has been a little bit more sensitive than right. However, both feet have been very tender. States that when she was last seen here in July she felt like she did not keep an appropriate response to her symptoms. Patient tried Voltaren gel and lacing alternatives with no relief. Patient also had nails and corns debrided with no additional benefit or relief. Patient denies any other pedal complaints. Denies injury/trip/fall/sprain/any causative factors.   There are no active problems to display for this patient.  Current Outpatient Prescriptions on File Prior to Visit  Medication Sig Dispense Refill  . aspirin 81 MG tablet Take by mouth daily.    . diazepam (VALIUM) 2 MG tablet Take 1 tablet (2 mg total) by mouth at bedtime as needed for muscle spasms. 30 tablet 2  . diclofenac sodium (VOLTAREN) 1 % GEL Apply 2 g topically 4 (four) times daily. Rub into affected area of foot 2 to 4 times daily 100 g 2  . docusate sodium (COLACE) 100 MG capsule Take 100 mg by mouth 2 (two) times daily.    . ferrous sulfate 325 (65 FE) MG tablet Take 324 mg by mouth daily with breakfast.    . furosemide (LASIX) 80 MG tablet Take 80 mg by mouth.    Marland Kitchen glipiZIDE (GLUCOTROL) 10 MG tablet Take 10 mg by mouth daily before breakfast.    . lisinopril-hydrochlorothiazide (PRINZIDE,ZESTORETIC) 10-12.5 MG per tablet Take 1 tablet by mouth daily.    . Naftifine HCl (NAFTIN) 2 % CREA Apply 1 application topically 1 day or 1 dose. (Patient not taking: Reported on 04/25/2015) 60 g 2  . potassium chloride (MICRO-K) 10 MEQ CR capsule Take 10 mEq by mouth 2 (two) times daily.    . pravastatin (PRAVACHOL) 20 MG tablet Take 20 mg by mouth  daily.    . solifenacin (VESICARE) 10 MG tablet Take by mouth daily.    . Vitamin D, Ergocalciferol, (DRISDOL) 50000 UNITS CAPS capsule Take 50,000 Units by mouth every 7 (seven) days.     No current facility-administered medications on file prior to visit.   Allergies  Allergen Reactions  . Levaquin [Levofloxacin In D5w]    Objective:  General: Alert and oriented x3 in no acute distress  Dermatology: No open lesions bilateral lower extremities, very minimal callus formation bilateral, no webspace macerations, no ecchymosis bilateral, all nails x 10 are short well manicured.  Vascular: Dorsalis Pedis and Posterior Tibial pedal pulses 2/4, Capillary Fill Time 3 seconds,(+) pedal hair growth bilateral, no edema bilateral lower extremities, Temperature gradient within normal limits.  Neurology: Gross sensation intact via light touch bilateral, Protective sensation intact  with Semmes Weinstein Monofilament to all pedal sites, Position sense intact, vibratory intact bilateral, Deep tendon reflexes within normal limits bilateral, No babinski sign present bilateral. (-)Tinels sign.   Musculoskeletal: Mild tenderness with palpation at dorsal and plantar surfaces of left greater than right foot, diffuse in nature. Every area palpated elicits tenderness,No pain with calf compression bilateral. Ankle, Subtalar joint range of motion is within normal limits, there is a thickened bunion and hammertoe deformity and dorsal midfoot exostosis, consistent with osteoarthritis.Strength within normal limits in all groups bilateral.  Xrays  Left Foot    Impression: Mild decrease in osseous mineralization. There is a posterior and inferior calcaneal spur significant midfoot osteoarthritis with dorsal spurring, significant bunion and hammertoe. No acute fracture or pathology. Soft tissues within normal limits.  Assessment and Plan: Problem List Items Addressed This Visit    None    Visit Diagnoses    Left  foot pain    -  Primary    Relevant Medications    methylPREDNISolone (MEDROL DOSEPAK) 4 MG TBPK tablet    Other Relevant Orders    DG Foot 2 Views Left    Right foot pain        Relevant Medications    methylPREDNISolone (MEDROL DOSEPAK) 4 MG TBPK tablet    Capsulitis        Relevant Medications    methylPREDNISolone (MEDROL DOSEPAK) 4 MG TBPK tablet    Bunion        Relevant Medications    methylPREDNISolone (MEDROL DOSEPAK) 4 MG TBPK tablet    Hammer toe, unspecified laterality        Relevant Medications    methylPREDNISolone (MEDROL DOSEPAK) 4 MG TBPK tablet    Pes planus of both feet        Osteoarthritis of ankle and foot, unspecified laterality        Relevant Medications    methylPREDNISolone (MEDROL DOSEPAK) 4 MG TBPK tablet       -Complete examination performed -Xrays reviewed -Discussed treatement options for likely arthritic changes to both feet with significant bunion and hammertoe deformity and likely capsulitis. Explained to patient that since her symptomology is diffuse in nature, most likely arthritic component to the pain. Explained to patient that pain and symptoms may never be completely resolved, especially since her condition is also complicated by diabetes. -Rx Medrol dose pack and advised patient to monitor blood sugars while on medication -Instructed to continue with current medications she is on gabapentin, Diclofenac, and pain medications as prescribed by [facility]/ primary care doctor -Recommend ice and Epson salt soaks as needed to help with comfort and symptoms -Recommend good supportive shoes for foot type -Patient to return to office in 4 weeks or sooner if condition worsens.  [name], DPM

## 2015-11-30 ENCOUNTER — Ambulatory Visit: Payer: Medicare Other | Admitting: Sports Medicine

## 2015-12-14 ENCOUNTER — Telehealth: Payer: Self-pay | Admitting: *Deleted

## 2015-12-14 ENCOUNTER — Ambulatory Visit (INDEPENDENT_AMBULATORY_CARE_PROVIDER_SITE_OTHER): Payer: Medicare Other | Admitting: Sports Medicine

## 2015-12-14 ENCOUNTER — Encounter: Payer: Self-pay | Admitting: Sports Medicine

## 2015-12-14 DIAGNOSIS — M19079 Primary osteoarthritis, unspecified ankle and foot: Secondary | ICD-10-CM

## 2015-12-14 DIAGNOSIS — M2550 Pain in unspecified joint: Secondary | ICD-10-CM | POA: Diagnosis not present

## 2015-12-14 DIAGNOSIS — E1140 Type 2 diabetes mellitus with diabetic neuropathy, unspecified: Secondary | ICD-10-CM

## 2015-12-14 DIAGNOSIS — M79673 Pain in unspecified foot: Secondary | ICD-10-CM | POA: Diagnosis not present

## 2015-12-14 DIAGNOSIS — B351 Tinea unguium: Secondary | ICD-10-CM

## 2015-12-14 NOTE — Progress Notes (Signed)
Patient ID: Frances Newman, female   DOB: 07/15/1946, 70 y.o.   MRN: [id number] Subjective: Frances Newman is a 70 y.o. female patient with history of diabetes who presents to office today complaining of long, painful nails  while ambulating in shoes; unable to trim. Patient states that the glucose reading this morning was 101 mg/dl. Patient denies any new changes in medication or new problems. Patient denies any new cramping, numbness, burning or tingling in the legs.   Patient states that for the pain in her feet. She did not tolerate the Medrol Dosepak well; experienced episode of hypotension thus talked with her primary care who stop medication and started her on Celebrex with no improvement and diffuse pain.   Patient is complaining of more persistent, multiple joint pain and will like to discuss any other treatment option has tried several anti-inflammatories pain medications without relief. Admits to a family history of rheumatoid arthritis and will like to see a rheumatologist.   There are no active problems to display for this patient.  Current Outpatient Prescriptions on File Prior to Visit  Medication Sig Dispense Refill  . aspirin 81 MG tablet Take by mouth daily.    . cloNIDine-chlorthalidone (CLORPRES) 0.1-15 MG tablet Take 0.5 tablets by mouth 2 (two) times daily.    . diazepam (VALIUM) 2 MG tablet Take 1 tablet (2 mg total) by mouth at bedtime as needed for muscle spasms. 30 tablet 2  . diclofenac sodium (VOLTAREN) 1 % GEL Apply 2 g topically 4 (four) times daily. Rub into affected area of foot 2 to 4 times daily 100 g 2  . docusate sodium (COLACE) 100 MG capsule Take 100 mg by mouth 2 (two) times daily.    . ferrous sulfate 325 (65 FE) MG tablet Take 324 mg by mouth daily with breakfast.    . furosemide (LASIX) 80 MG tablet Take 80 mg by mouth.    . gabapentin (NEURONTIN) 600 MG tablet Take 600 mg by mouth 2 (two) times daily.    Marland Kitchen. glipiZIDE (GLUCOTROL) 10 MG tablet Take 10 mg  by mouth daily before breakfast.    . lisinopril-hydrochlorothiazide (PRINZIDE,ZESTORETIC) 10-12.5 MG per tablet Take 1 tablet by mouth daily.    Marland Kitchen. loratadine (CLARITIN) 10 MG tablet Take 10 mg by mouth daily.    . metoprolol tartrate (LOPRESSOR) 25 MG tablet Take 12.5 mg by mouth 2 (two) times daily.    . Naftifine HCl (NAFTIN) 2 % CREA Apply 1 application topically 1 day or 1 dose. (Patient not taking: Reported on 04/25/2015) 60 g 2  . potassium chloride (MICRO-K) 10 MEQ CR capsule Take 10 mEq by mouth 2 (two) times daily.    . pravastatin (PRAVACHOL) 20 MG tablet Take 20 mg by mouth daily.    . solifenacin (VESICARE) 10 MG tablet Take by mouth daily.    . traZODone (DESYREL) 100 MG tablet Take 100 mg by mouth at bedtime.    . Vitamin D, Ergocalciferol, (DRISDOL) 50000 UNITS CAPS capsule Take 50,000 Units by mouth every 7 (seven) days.     No current facility-administered medications on file prior to visit.   Allergies  Allergen Reactions  . Levaquin [Levofloxacin In D5w]   . Prednisone Other (See Comments)    Hypotension    No results found for this or any previous visit (from the past 2160 hour(s)).  Objective: General: Patient is awake, alert, and oriented x 3 and in no acute distress.  Integument: Skin is warm, dry  and supple bilateral. Nails are tender, long, thickened and  dystrophic with subungual debris, consistent with onychomycosis, 1-5 bilateral;  There are spicules of nail at Bilateral hallux from previous nail procedures. No signs of infection. No open lesions or preulcerative lesions present bilateral. Remaining integument unremarkable.  Vasculature:  Dorsalis Pedis pulse 2/4 bilateral. Posterior Tibial pulse  2/4 bilateral.  Capillary fill time <3 sec 1-5 bilateral. Positive hair growth to the level of the digits. Temperature gradient within normal limits. No varicosities present bilateral. No edema present bilateral.   Neurology: The patient has intact sensation  measured with a 5.07/10g Semmes Weinstein Monofilament at all pedal sites bilateral . Vibratory sensation diminished bilateral with tuning fork. No Babinski sign present bilateral.   Musculoskeletal: Continued, mild tenderness to both the dorsal and plantar aspects that are diffuse in nature with midfoot dorsal exostosis, right greater than left.  Bunion and hammertoe deformity bilateral. Muscular strength 5/5 in all lower extremity muscular groups bilateral. No tenderness with calf compression bilateral.  Assessment and Plan: Problem List Items Addressed This Visit    None    Visit Diagnoses    Dermatophytosis of nail    -  Primary    Type 2 diabetes mellitus with diabetic neuropathy, unspecified long term insulin use status (HCC)        Foot pain, unspecified laterality        Osteoarthritis of ankle and foot, unspecified laterality        Pain, joint, multiple sites          -Examined patient. -Discussed and educated patient on diabetic foot care, especially with  regards to the vascular, neurological and musculoskeletal systems.  -Stressed the importance of good glycemic control and the detriment of not  controlling glucose levels in relation to the foot. -Mechanically debrided all nails 1-5 bilateral using sterile nail nipper and filed with dremel without incident  - referral made to rheumatology for further evaluation due to Patient continued complaints of multiple joint pain and positive family history of rheumatoid wanting further workup or evaluation in the setting of no improvement on multiple medications including gabapentin, diclofenac, and pain medication -Answered all patient questions -Patient to return as needed or in 3 months for at risk foot care -Patient advised to call the office if any problems or questions arise in the  Meantime.  [name], DPM

## 2016-01-23 NOTE — Telephone Encounter (Signed)
Entered in error

## 2016-03-15 ENCOUNTER — Ambulatory Visit: Payer: Medicare Other | Admitting: Sports Medicine

## 2017-02-21 HISTORY — PX: ESOPHAGOGASTRODUODENOSCOPY: SHX1529

## 2018-02-21 ENCOUNTER — Encounter: Payer: Self-pay | Admitting: Gastroenterology

## 2018-03-13 ENCOUNTER — Telehealth: Payer: Self-pay | Admitting: Gastroenterology

## 2018-03-13 MED ORDER — DEXLANSOPRAZOLE 60 MG PO CPDR: 60.0000 mg | DELAYED_RELEASE_CAPSULE | Freq: Every day | ORAL | 4 refills | Status: DC

## 2018-03-13 NOTE — Telephone Encounter (Signed)
Sent refills to patients pharmacy.  

## 2018-03-24 ENCOUNTER — Encounter: Payer: Self-pay | Admitting: Gastroenterology

## 2018-03-25 ENCOUNTER — Ambulatory Visit: Payer: Self-pay | Admitting: Gastroenterology

## 2018-04-17 ENCOUNTER — Encounter: Payer: Self-pay | Admitting: Gastroenterology

## 2018-06-27 ENCOUNTER — Other Ambulatory Visit: Payer: Medicare Other

## 2018-06-27 ENCOUNTER — Encounter: Payer: Self-pay | Admitting: Gastroenterology

## 2018-06-27 ENCOUNTER — Ambulatory Visit (INDEPENDENT_AMBULATORY_CARE_PROVIDER_SITE_OTHER): Payer: Medicare Other | Admitting: Gastroenterology

## 2018-06-27 VITALS — BP 148/78 | HR 76 | Ht 61.5 in | Wt 161.1 lb

## 2018-06-27 DIAGNOSIS — R1032 Left lower quadrant pain: Secondary | ICD-10-CM

## 2018-06-27 DIAGNOSIS — Z800 Family history of malignant neoplasm of digestive organs: Secondary | ICD-10-CM

## 2018-06-27 DIAGNOSIS — R197 Diarrhea, unspecified: Secondary | ICD-10-CM | POA: Diagnosis not present

## 2018-06-27 DIAGNOSIS — K625 Hemorrhage of anus and rectum: Secondary | ICD-10-CM

## 2018-06-27 NOTE — Patient Instructions (Signed)
If you are age 70 or older, your body mass index should be between 23-30. Your Body mass index is 29.95 kg/m. If this is out of the aforementioned range listed, please consider follow up with your Primary Care Provider.  If you are age 70 or younger, your body mass index should be between 19-25. Your Body mass index is 29.95 kg/m. If this is out of the aformentioned range listed, please consider follow up with your Primary Care Provider.   Please call back in 1 week to schedule the colonoscopy.  Thank you,  Dr. Lynann Bolognaajesh Gupta\

## 2018-06-27 NOTE — Progress Notes (Signed)
Chief Complaint: Abdominal pain/diarrhea  Referring Provider:  Shelbie Ammons, MD      ASSESSMENT AND PLAN;   #1. LLQ pain with diarrhea (status post empiric treatment for acute diverticulitis in the past, negative CT scan abdomen and pelvis at [facility] ED per patient, had labs).  Previous history of significant constipation. - Please obtain previous records - CT report and labs from [facility] ED - GI pathogen and stool for WBCs. - Patient brings in the list of medications which includes Symproic and Amitiza.  She also has been taking Colace.  I have instructed her to stop taking these medications and call us with the list of her medications when she reaches home today.  #2. Rectal bleeding (heme-negative stools today) - Proceed with colonoscopy with MiraLAX preparation.  I have discussed the risks and benefits.  The risks including risk of perforation requiring laparotomy, bleeding after polypectomy requiring blood transfusions and risks of anesthesia/sedation were discussed.  Rare risks of missing colorectal neoplasms were also discussed.  Alternatives were given.  Patient is fully aware and agrees to proceed. All the questions were answered. Colonoscopy will be scheduled in upcoming days.  Patient is to report immediately if there is any significant weight loss or excessive bleeding until then. Consent forms were given for review. #3. FH colon cancer (dad at age 26)  Above was discussed with the patient's adopted daughter as well.   HPI:    Frances Newman is a 72 y.o. female  Very poor historian Has long-standing history of constipation in the past, has been having diarrhea over the last 3 to 4 weeks at the frequency of 3-4 bowel movements per day. Describes this as soft bowel movements with occasional rectal bleeding Has lower abdominal pain mainly on the left side Seen by Dr. Peter Congo studies were negative per patient, negative abdominal ultrasound Then went to the  emergency room at [facility] -underwent CT scan of the abdomen and pelvis which was unremarkable according to the patient.  Had labs.  Was told to come to the GI clinic for further evaluation. We have no records of the above visits.  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  Biopsies were negative -Colonoscopy 10/20/2010 (adult) mild sigmoid diverticulosis, small internal hemorrhoids. -Upper GI with small bowel follow-through 01/29/2017 normal -Capsule endoscopy 06/2006 normal, incidental lymphangiectasia  Past Medical History:  Diagnosis Date  . Arthritis   . Asthma   . Cough   . Diabetes mellitus without complication (HCC)   . Gall bladder disease   . GERD (gastroesophageal reflux disease)   . Hypertension   . Sinus problem   . Swelling     Past Surgical History:  Procedure Laterality Date  . BACK SURGERY    . COLONOSCOPY  10/20/2010   Mild sigmoid diverticulosis. Small internal hemorrhoids.   . ESOPHAGOGASTRODUODENOSCOPY  02/21/2017   Presbyesophagus status esophageal dilataion. Small antral polyps polypectomy.   Marland Kitchen FOOT SURGERY    . KNEE SURGERY    . OVARY SURGERY      No family history on file.  Social History   Tobacco Use  . Smoking status: Former Games developer  . Smokeless tobacco: Never Used  . Tobacco comment: quit 35 years  Substance Use Topics  . Alcohol use: No    Alcohol/week: 0.0 standard drinks  . Drug use: No    Current Outpatient Medications  Medication Sig Dispense Refill  . aspirin 81  MG tablet Take by mouth daily.    . cloNIDine-chlorthalidone (CLORPRES) 0.1-15 MG tablet Take 0.5 tablets by mouth 2 (two) times daily.    Marland Kitchen. dexlansoprazole (DEXILANT) 60 MG capsule Take 1 capsule (60 mg total) by mouth daily. 30 capsule 4  . diazepam (VALIUM) 2 MG tablet Take 1 tablet (2 mg total) by mouth at bedtime as needed for muscle spasms. 30 tablet 2  . diclofenac sodium (VOLTAREN) 1 % GEL  Apply 2 g topically 4 (four) times daily. Rub into affected area of foot 2 to 4 times daily 100 g 2  . docusate sodium (COLACE) 100 MG capsule Take 100 mg by mouth 2 (two) times daily.    . ferrous sulfate 325 (65 FE) MG tablet Take 324 mg by mouth daily with breakfast.    . furosemide (LASIX) 80 MG tablet Take 80 mg by mouth.    . gabapentin (NEURONTIN) 600 MG tablet Take 600 mg by mouth 2 (two) times daily.    Marland Kitchen. glipiZIDE (GLUCOTROL) 10 MG tablet Take 10 mg by mouth daily before breakfast.    . lisinopril-hydrochlorothiazide (PRINZIDE,ZESTORETIC) 10-12.5 MG per tablet Take 1 tablet by mouth daily.    Marland Kitchen. loratadine (CLARITIN) 10 MG tablet Take 10 mg by mouth daily.    . metoprolol tartrate (LOPRESSOR) 25 MG tablet Take 25 mg by mouth daily.     . potassium chloride (MICRO-K) 10 MEQ CR capsule Take 10 mEq by mouth 2 (two) times daily.    . pravastatin (PRAVACHOL) 20 MG tablet Take 20 mg by mouth daily.    . solifenacin (VESICARE) 10 MG tablet Take by mouth daily.    . traZODone (DESYREL) 100 MG tablet Take 100 mg by mouth as needed.     . Vitamin D, Ergocalciferol, (DRISDOL) 50000 UNITS CAPS capsule Take 50,000 Units by mouth every 7 (seven) days.    . Naftifine HCl (NAFTIN) 2 % CREA Apply 1 application topically 1 day or 1 dose. (Patient not taking: Reported on 04/25/2015) 60 g 2  . Oxycodone HCl 20 MG TABS Take 1 tablet by mouth every 8 (eight) hours as needed.  0   No current facility-administered medications for this visit.     Allergies  Allergen Reactions  . Levaquin [Levofloxacin In D5w]   . Prednisone Other (See Comments)    Hypotension    Review of Systems:  Constitutional: Denies fever, chills, diaphoresis, appetite change and fatigue.  HEENT: Denies photophobia, eye pain, redness, hearing loss, ear pain, congestion, sore throat, rhinorrhea, sneezing, mouth sores, neck pain, neck stiffness and tinnitus.   Respiratory: Denies SOB, DOE, cough, chest tightness,  and wheezing.     Cardiovascular: Denies chest pain, palpitations and leg swelling.  Genitourinary: Denies dysuria, urgency, frequency, hematuria, flank pain and difficulty urinating.  Musculoskeletal: Denies myalgias, back pain, joint swelling, arthralgias and gait problem.  Skin: No rash.  Neurological: Denies dizziness, seizures, syncope, weakness, light-headedness, numbness and headaches.  Hematological: Denies adenopathy. Easy bruising, personal or family bleeding history  Psychiatric/Behavioral: No anxiety or depression     Physical Exam:    BP (!) 148/78   Pulse 76   Ht 5' 1.5" (1.562 m)   Wt 161 lb 2 oz (73.1 kg)   LMP  (LMP Unknown)   BMI 29.95 kg/m  Filed Weights   06/27/18 1125  Weight: 161 lb 2 oz (73.1 kg)   Constitutional:  Well-developed, in no acute distress. Psychiatric: Normal mood and affect. Behavior is normal. HEENT: Pupils normal.  Conjunctivae are normal. No scleral icterus. Neck supple.  Cardiovascular: Normal rate, regular rhythm. No edema Pulmonary/chest: Effort normal and breath sounds normal. No wheezing, rales or rhonchi. Abdominal: Soft, nondistended.  Mild lower abdominal tenderness. Bowel sounds active throughout. There are no masses palpable. No hepatomegaly. Rectal: Manson Passey, heme-negative stools no obvious bleeding. Seen in presence of Brooke Neurological: Alert and oriented to person place and time. Skin: Skin is warm and dry. No rashes noted.  Data Reviewed: I have personally reviewed following labs and imaging studies  CBC: CBC Latest Ref Rng & Units 05/19/2010 09/29/2009 07/30/2008  WBC 4.0 - 10.5 K/uL 10.0 8.4 -  Hemoglobin 12.0 - 15.0 g/dL 11.6(L) 11.5(L) 11.3(L)  Hematocrit 36.0 - 46.0 % 36.7 35.2(L) 34.7(L)  Platelets 150 - 400 K/uL 239 208 -    CMP: CMP Latest Ref Rng & Units 05/19/2010 09/29/2009 07/30/2008  Glucose 70 - 99 mg/dL 161(W) 70 960(A)  BUN 6 - 23 mg/dL 21 11 6   Creatinine 0.4 - 1.2 mg/dL 5.40 9.81 1.91  Sodium 135 - 145 mEq/L 144  139 140  Potassium 3.5 - 5.1 mEq/L 5.3(H) 3.8 4.1  Chloride 96 - 112 mEq/L 110 110 109  CO2 19 - 32 mEq/L 28 24 25   Calcium 8.4 - 10.5 mg/dL 9.6 9.1 9.5  Total Protein 6.0 - 8.3 g/dL - 6.4 -  Total Bilirubin 0.3 - 1.2 mg/dL - 0.6 -  Alkaline Phos 39 - 117 U/L - 83 -  AST 0 - 37 U/L - 17 -  ALT 0 - 35 U/L - 12 -  25 minutes spent with the patient today. Greater than 50% was spent in counseling and coordination of care with the patient    Edman Circle, MD 06/27/2018, 11:53 AM  Cc: Shelbie Ammons, MD

## 2018-07-03 LAB — GASTROINTESTINAL PATHOGEN PANEL PCR
C. DIFFICILE TOX A/B, PCR: NOT DETECTED
CRYPTOSPORIDIUM, PCR: NOT DETECTED
Campylobacter, PCR: NOT DETECTED
E COLI (ETEC) LT/ST, PCR: NOT DETECTED
E COLI 0157, PCR: NOT DETECTED
E coli (STEC) stx1/stx2, PCR: NOT DETECTED
GIARDIA LAMBLIA, PCR: NOT DETECTED
Norovirus, PCR: NOT DETECTED
Rotavirus A, PCR: NOT DETECTED
Salmonella, PCR: NOT DETECTED
Shigella, PCR: NOT DETECTED

## 2018-07-03 LAB — FECAL LACTOFERRIN, QUANT
Fecal Lactoferrin: POSITIVE — AB
MICRO NUMBER:: 91132107
SPECIMEN QUALITY:: ADEQUATE

## 2018-07-04 ENCOUNTER — Encounter: Payer: Self-pay | Admitting: Gastroenterology

## 2018-07-08 ENCOUNTER — Ambulatory Visit: Payer: Medicaid Other | Admitting: Gastroenterology

## 2018-07-31 ENCOUNTER — Ambulatory Visit (AMBULATORY_SURGERY_CENTER): Payer: Self-pay

## 2018-07-31 VITALS — Ht 61.5 in | Wt 156.0 lb

## 2018-07-31 DIAGNOSIS — Z800 Family history of malignant neoplasm of digestive organs: Secondary | ICD-10-CM

## 2018-07-31 DIAGNOSIS — R197 Diarrhea, unspecified: Secondary | ICD-10-CM

## 2018-07-31 NOTE — Progress Notes (Signed)
Per pt, no allergies to soy or egg products.Pt not taking any weight loss meds or using  O2 at home.  Pt refused emmi video  Pt came into the office today for her PV prior to her colon on 08/14/16. The daughter was with the pt. I spent over an hour with the pt and her daughter.The pt could not answer questions regarding what  meds she was taking. We called [facility] in [place] to review the medications. The pt was not aware she was not taking her diabetic meds for over a month.  Informed the pt to get an updated list of her medications prior to her colon on 08/14/18,  because it was very important for the doctor to know prior to her colonoscopy. She understood. They will call back with any questions. Cherylann Ratel

## 2018-08-04 ENCOUNTER — Telehealth: Payer: Self-pay | Admitting: Gastroenterology

## 2018-08-04 MED ORDER — NA SULFATE-K SULFATE-MG SULF 17.5-3.13-1.6 GM/177ML PO SOLN: 1.0000 | Freq: Once | ORAL | 0 refills | Status: AC

## 2018-08-04 NOTE — Telephone Encounter (Signed)
Suprep sent to [facility] at pt's request  Hilda Lias PV

## 2018-08-05 MED ORDER — NA SULFATE-K SULFATE-MG SULF 17.5-3.13-1.6 GM/177ML PO SOLN: 1.0000 | Freq: Once | ORAL | 0 refills | Status: AC

## 2018-08-05 NOTE — Addendum Note (Signed)
Addended by: Heloise Purpura on: 08/05/2018 09:37 AM   Modules accepted: Orders

## 2018-08-14 ENCOUNTER — Ambulatory Visit (AMBULATORY_SURGERY_CENTER): Payer: Medicare Other | Admitting: Gastroenterology

## 2018-08-14 ENCOUNTER — Encounter: Payer: Self-pay | Admitting: Gastroenterology

## 2018-08-14 VITALS — BP 105/48 | HR 84 | Temp 97.5°F | Resp 14 | Ht 61.0 in | Wt 156.0 lb

## 2018-08-14 DIAGNOSIS — K633 Ulcer of intestine: Secondary | ICD-10-CM

## 2018-08-14 DIAGNOSIS — Z86010 Personal history of colonic polyps: Secondary | ICD-10-CM | POA: Diagnosis not present

## 2018-08-14 DIAGNOSIS — K5140 Inflammatory polyps of colon without complications: Secondary | ICD-10-CM | POA: Diagnosis not present

## 2018-08-14 DIAGNOSIS — Z800 Family history of malignant neoplasm of digestive organs: Secondary | ICD-10-CM | POA: Diagnosis not present

## 2018-08-14 MED ORDER — SODIUM CHLORIDE 0.9 % IV SOLN: 500.0000 mL | Freq: Once | INTRAVENOUS | Status: DC

## 2018-08-14 NOTE — Op Note (Signed)
[facility] Patient Name: Frances Newman Procedure Date: 08/14/2018 11:11 AM MRN: [id number] Endoscopist: Lynann Bologna , MD Age: 72 Referring MD:  Date of Birth: [birth year] Gender: Female Account #: [id number] Procedure:                Colonoscopy Indications:              High risk colon cancer surveillance: Personal                            history of colonic polyps. Family history of colon                            cancer (father at the age of 3) Medicines:                Monitored Anesthesia Care Procedure:                Pre-Anesthesia Assessment:                           - Prior to the procedure, a History and Physical                            was performed, and patient medications and                            allergies were reviewed. The patient's tolerance of                            previous anesthesia was also reviewed. The risks                            and benefits of the procedure and the sedation                            options and risks were discussed with the patient.                            All questions were answered, and informed consent                            was obtained. Prior Anticoagulants: The patient has                            taken no previous anticoagulant or antiplatelet                            agents. ASA Grade Assessment: II - A patient with                            mild systemic disease. After reviewing the risks                            and benefits, the patient was deemed in  satisfactory condition to undergo the procedure.                           After obtaining informed consent, the colonoscope                            was passed under direct vision. Throughout the                            procedure, the patient's blood pressure, pulse, and                            oxygen saturations were monitored continuously. The                            Colonoscope was introduced  through the anus and                            advanced to the 2 cm into the ileum. The                            colonoscopy was performed without difficulty. The                            patient tolerated the procedure well. The quality                            of the bowel preparation was good. Scope In: 11:13:41 AM Scope Out: 11:27:33 AM Scope Withdrawal Time: 0 hours 10 minutes 48 seconds  Total Procedure Duration: 0 hours 13 minutes 52 seconds  Findings:                 A 4 mm polyp was found in the proximal ascending                            colon with scar at the site of previous                            polypectomy. The polyp was sessile. The polyp was                            removed with a cold biopsy forceps. Multiple                            biopsies were taken around the polypectomy site as                            well.                           A few small-mouthed diverticula were found in the                            sigmoid colon.  Non-bleeding external and internal hemorrhoids were                            found during retroflexion and during perianal exam.                            The hemorrhoids were small.                           The exam was otherwise without abnormality. Complications:            No immediate complications. Estimated Blood Loss:     Estimated blood loss: none. Impression:               - Colonic polyp status post polypectomy.                           - Mild sigmoid diverticulosis                           - Non-bleeding external and internal hemorrhoids. Recommendation:           - Patient has a contact number available for                            emergencies. The signs and symptoms of potential                            delayed complications were discussed with the                            patient. Return to normal activities tomorrow.                            Written discharge  instructions were provided to the                            patient.                           - Resume previous diet.                           - Continue present medications.                           - No ibuprofen, naproxen, or other non-steroidal                            anti-inflammatory drugs for 5 days after polyp                            removal.                           - Repeat colonoscopy for surveillance based on  pathology results.                           - Return to GI clinic PRN.                           - Preparation H cream: Apply externally BID PRN. Lynann Bologna, MD 08/14/2018 11:37:15 AM This report has been signed electronically.

## 2018-08-14 NOTE — Progress Notes (Signed)
Called to room to assist during endoscopic procedure.  Patient ID and intended procedure confirmed with present staff. Received instructions for my participation in the procedure from the performing physician.  

## 2018-08-14 NOTE — Progress Notes (Signed)
To PACU, VSS. Report to [email or web address] 

## 2018-08-14 NOTE — Progress Notes (Signed)
Pt's states no medical or surgical changes since previsit or office visit. 

## 2018-08-14 NOTE — Patient Instructions (Signed)
YOU HAD AN ENDOSCOPIC PROCEDURE TODAY AT THE [facility]:   Refer to the procedure report that was given to you for any specific questions about what was found during the examination.  If the procedure report does not answer your questions, please call your gastroenterologist to clarify.  If you requested that your care partner not be given the details of your procedure findings, then the procedure report has been included in a sealed envelope for you to review at your convenience later.  YOU SHOULD EXPECT: Some feelings of bloating in the abdomen. Passage of more gas than usual.  Walking can help get rid of the air that was put into your GI tract during the procedure and reduce the bloating. If you had a lower endoscopy (such as a colonoscopy or flexible sigmoidoscopy) you may notice spotting of blood in your stool or on the toilet paper. If you underwent a bowel prep for your procedure, you may not have a normal bowel movement for a few days.  Please Note:  You might notice some irritation and congestion in your nose or some drainage.  This is from the oxygen used during your procedure.  There is no need for concern and it should clear up in a day or so.  SYMPTOMS TO REPORT IMMEDIATELY:   Following lower endoscopy (colonoscopy or flexible sigmoidoscopy):  Excessive amounts of blood in the stool  Significant tenderness or worsening of abdominal pains  Swelling of the abdomen that is new, acute  Fever of 100F or higher  For urgent or emergent issues, a gastroenterologist can be reached at any hour by calling [phone].   DIET:  We do recommend a small meal at first, but then you may proceed to your regular diet.  Drink plenty of fluids but you should avoid alcoholic beverages for 24 hours.  ACTIVITY:  You should plan to take it easy for the rest of today and you should NOT DRIVE or use heavy machinery until tomorrow (because of the sedation medicines used during the test).     FOLLOW UP: Our staff will call the number listed on your records the next business day following your procedure to check on you and address any questions or concerns that you may have regarding the information given to you following your procedure. If we do not reach you, we will leave a message.  However, if you are feeling well and you are not experiencing any problems, there is no need to return our call.  We will assume that you have returned to your regular daily activities without incident.  If any biopsies were taken you will be contacted by phone or by letter within the next 1-3 weeks.  Please call us at [phone] if you have not heard about the biopsies in 3 weeks.    SIGNATURES/CONFIDENTIALITY: You and/or your care partner have signed paperwork which will be entered into your electronic medical record.  These signatures attest to the fact that that the information above on your After Visit Summary has been reviewed and is understood.  Full responsibility of the confidentiality of this discharge information lies with you and/or your care-partner. 

## 2018-08-15 ENCOUNTER — Telehealth: Payer: Self-pay

## 2018-08-15 NOTE — Telephone Encounter (Signed)
  Follow up Call-  Call back number 08/14/2018  Post procedure Call Back phone  # [phone]  Permission to leave phone message Yes  Some recent data might be hidden     Patient questions:  Do you have a fever, pain , or abdominal swelling? No. Pain Score  0 *  Have you tolerated food without any problems? Yes.    Have you been able to return to your normal activities? Yes.    Do you have any questions about your discharge instructions: Diet   No. Medications  No. Follow up visit  No.  Do you have questions or concerns about your Care? No.  Actions: * If pain score is 4 or above: No action needed, pain <4.

## 2018-08-23 ENCOUNTER — Encounter: Payer: Self-pay | Admitting: Gastroenterology

## 2018-10-30 ENCOUNTER — Ambulatory Visit (INDEPENDENT_AMBULATORY_CARE_PROVIDER_SITE_OTHER): Payer: Medicare Other | Admitting: Physician Assistant

## 2018-10-30 ENCOUNTER — Encounter: Payer: Self-pay | Admitting: Physician Assistant

## 2018-10-30 ENCOUNTER — Other Ambulatory Visit (INDEPENDENT_AMBULATORY_CARE_PROVIDER_SITE_OTHER): Payer: Medicare Other

## 2018-10-30 VITALS — BP 132/68 | HR 64 | Ht 61.0 in | Wt 142.8 lb

## 2018-10-30 DIAGNOSIS — K625 Hemorrhage of anus and rectum: Secondary | ICD-10-CM

## 2018-10-30 DIAGNOSIS — R634 Abnormal weight loss: Secondary | ICD-10-CM | POA: Diagnosis not present

## 2018-10-30 DIAGNOSIS — R109 Unspecified abdominal pain: Secondary | ICD-10-CM | POA: Diagnosis not present

## 2018-10-30 DIAGNOSIS — R197 Diarrhea, unspecified: Secondary | ICD-10-CM | POA: Diagnosis not present

## 2018-10-30 LAB — COMPREHENSIVE METABOLIC PANEL
ALT: 7 U/L (ref 0–35)
AST: 11 U/L (ref 0–37)
Albumin: 3.4 g/dL — ABNORMAL LOW (ref 3.5–5.2)
Alkaline Phosphatase: 61 U/L (ref 39–117)
BILIRUBIN TOTAL: 0.3 mg/dL (ref 0.2–1.2)
BUN: 20 mg/dL (ref 6–23)
CO2: 18 mEq/L — ABNORMAL LOW (ref 19–32)
Calcium: 8.8 mg/dL (ref 8.4–10.5)
Chloride: 111 mEq/L (ref 96–112)
Creatinine, Ser: 1.21 mg/dL — ABNORMAL HIGH (ref 0.40–1.20)
GFR: 53.12 mL/min — ABNORMAL LOW (ref >60.00)
GLUCOSE: 100 mg/dL — AB (ref 70–99)
Potassium: 4.5 mEq/L (ref 3.5–5.1)
Sodium: 138 mEq/L (ref 135–145)
Total Protein: 5.6 g/dL — ABNORMAL LOW (ref 6.0–8.3)

## 2018-10-30 LAB — CBC WITH DIFFERENTIAL/PLATELET
Basophils Absolute: 0.1 K/uL (ref 0.0–0.1)
Basophils Relative: 0.6 % (ref 0.0–3.0)
Eosinophils Absolute: 0.1 K/uL (ref 0.0–0.7)
Eosinophils Relative: 1.2 % (ref 0.0–5.0)
HCT: 34.3 % — ABNORMAL LOW (ref 36.0–46.0)
Hemoglobin: 10.9 g/dL — ABNORMAL LOW (ref 12.0–15.0)
Lymphocytes Relative: 12.4 % (ref 12.0–46.0)
Lymphs Abs: 1 K/uL (ref 0.7–4.0)
MCHC: 31.7 g/dL (ref 30.0–36.0)
MCV: 86.8 fl (ref 78.0–100.0)
Monocytes Absolute: 0.8 K/uL (ref 0.1–1.0)
Monocytes Relative: 9.1 % (ref 3.0–12.0)
Neutro Abs: 6.3 K/uL (ref 1.4–7.7)
Neutrophils Relative %: 76.7 % (ref 43.0–77.0)
Platelets: 353 K/uL (ref 150.0–400.0)
RBC: 3.96 Mil/uL (ref 3.87–5.11)
RDW: 15.2 % (ref 11.5–15.5)
WBC: 8.3 K/uL (ref 4.0–10.5)

## 2018-10-30 LAB — HIGH SENSITIVITY CRP: CRP, High Sensitivity: 77.69 mg/L — ABNORMAL HIGH (ref 0.000–5.000)

## 2018-10-30 LAB — SEDIMENTATION RATE: Sed Rate: 55 mm/hr — ABNORMAL HIGH (ref 0–30)

## 2018-10-30 MED ORDER — DICYCLOMINE HCL 10 MG PO CAPS: 10.0000 mg | ORAL_CAPSULE | Freq: Three times a day (TID) | ORAL | 1 refills | Status: DC

## 2018-10-30 NOTE — Patient Instructions (Addendum)
Your provider has requested that you go to the basement level for lab work before leaving today. Press "B" on the elevator. The lab is located at the first door on the left as you exit the elevator. Stop the aricept,  Stop the Cipro.  Stop the Flagyl ( metronidazole)   Add Bentyl 10 mg by mouth 4 times daily for cramping, diarrhea.  Add Over the counter Imodium, take 1 tablet up to 4 times a day.   You have been scheduled for a CT scan of the abdomen and pelvis at [facility] ([address]---this is in the same building as Press photographer).   You are scheduled on Friday 10-31-18 at 11:15 am. You should arrive at 11:00 am to your appointment time for registration. Please follow the written instructions below on the day of your exam:  WARNING: IF YOU ARE ALLERGIC TO IODINE/X-RAY DYE, PLEASE NOTIFY RADIOLOGY IMMEDIATELY AT [phone]! YOU WILL BE GIVEN A 13 HOUR PREMEDICATION PREP.  1) Do not eat anything after 7:00 am (4 hours prior to your test) 2) You have been given 2 bottles of oral contrast to drink. The solution may taste better if refrigerated, but do NOT add ice or any other liquid to this solution. Shake well before drinking.    Drink 1 bottle of contrast @ 9:15 am (2 hours prior to your exam)  Drink 1 bottle of contrast @ 10:15 am (1 hour prior to your exam)  You may take any medications as prescribed with a small amount of water, if necessary. If you take any of the following medications: METFORMIN, GLUCOPHAGE, GLUCOVANCE, AVANDAMET, RIOMET, FORTAMET, [place] MET, JANUMET, GLUMETZA or METAGLIP, you MAY be asked to HOLD this medication 48 hours AFTER the exam.  The purpose of you drinking the oral contrast is to aid in the visualization of your intestinal tract. The contrast solution may cause some diarrhea. Depending on your individual set of symptoms, you may also receive an intravenous injection of x-ray contrast/dye. Plan on being at [facility] for 30  minutes or longer, depending on the type of exam you are having performed.  This test typically takes 30-45 minutes to complete.  If you have any questions regarding your exam or if you need to reschedule, you may call the CT department at [phone] between the hours of 8:00 am and 5:00 pm, Monday-Friday.  Normal BMI (Body Mass Index- based on height and weight) is between 23 and 30. Your BMI today is Body mass index is 26.98 kg/m. Marland Kitchen Please consider follow up  regarding your BMI with your Primary Care Provider.  ________________________________________________________________________

## 2018-10-30 NOTE — Progress Notes (Signed)
Subjective:    Patient ID: Frances Newman, female    DOB: April 04, 1946, 73 y.o.   MRN: [id number]  HPI Frances Newman is a pleasant 73 year old African-American female known to Dr. Chales Abrahams.  She has history of GERD, hypertension, adult onset diabetes mellitus and diverticular disease. She was last seen by Dr. Chales Abrahams in September 2019 with complaints of diarrhea, and intermittent low-grade rectal bleeding.  She had apparently been empirically treated for diverticulitis prior to that visit.  She underwent colonoscopy 08/14/2017, after GI pathogen panel returned negative.  Stool for lactoferrin had been positive. At colonoscopy she was found to have a 4 mm polyp in the proximal ascending colon and a few sigmoid diverticuli and nonbleeding internal hemorrhoids.  Path from the polyp showed inflammation and ulcerated colonic mucosa no malignancy.  Patient comes in today stating that she has had ongoing problems with the diarrhea become progressive over the past 4 months.  Her weight is down about 20 pounds since September 2019.  She says she is having at least 6-7 liquid bowel movements per day and is often incontinent at nighttime.  She has been wearing depends.  She has urgency for bowel movements and says she has been having intermittent accidents.  Stools have all been liquid over the past 2 to 3 months, no obvious blood but malodorous.  She has had some generalized abdominal discomfort but no severe pain.  Documented fever or chills. Yesterday she had an episode with significant abdominal cramping and had 2 episodes of passing blood clots in the commode separate from stool.  She is not had any further bleeding last night or today. She had been seen by her primary care in [place] yesterday and was started empirically on Cipro and Flagyl. Reviewing her meds she says she was started on Aricept sometime within the past 6 months.  She is also on Dexilant but is not sure how long she has been on that.  Review of  Systems Pertinent positive and negative review of systems were noted in the above HPI section.  All other review of systems was otherwise negative.  Outpatient Encounter Medications as of 10/30/2018  Medication Sig  . aspirin 81 MG tablet Take by mouth daily.  . Calcium Carbonate (CALCIUM 600 PO) Take by mouth daily.  . ciprofloxacin (CIPRO) 500 MG tablet Take 500 mg by mouth 2 (two) times daily.  . cloNIDine-chlorthalidone (CLORPRES) 0.1-15 MG tablet Take 0.5 tablets by mouth 2 (two) times daily.  Marland Kitchen dexlansoprazole (DEXILANT) 60 MG capsule Take 1 capsule (60 mg total) by mouth daily.  Marland Kitchen donepezil (ARICEPT) 5 MG tablet Take 5 mg by mouth at bedtime.  Marland Kitchen L-Methylfolate-Algae-B12-B6 (METANX) 3-90.314-2-35 MG CAPS Take 1 tablet by mouth 2 (two) times daily.  . metoprolol tartrate (LOPRESSOR) 25 MG tablet Take 12.5 mg by mouth 2 (two) times daily.   . metroNIDAZOLE (FLAGYL) 500 MG tablet Take 500 mg by mouth 3 (three) times daily.  . potassium chloride (MICRO-K) 10 MEQ CR capsule Take 10 mEq by mouth 2 (two) times daily.  . [DISCONTINUED] budesonide-formoterol (SYMBICORT) 160-4.5 MCG/ACT inhaler Inhale 2 puffs into the lungs as needed.  . [DISCONTINUED] diazepam (VALIUM) 2 MG tablet Take 1 tablet (2 mg total) by mouth at bedtime as needed for muscle spasms.  . [DISCONTINUED] diclofenac sodium (VOLTAREN) 1 % GEL Apply 2 g topically 4 (four) times daily. Rub into affected area of foot 2 to 4 times daily  . [DISCONTINUED] traZODone (DESYREL) 100 MG tablet Take 100 mg  by mouth as needed.   . [DISCONTINUED] Vitamin D, Ergocalciferol, (DRISDOL) 50000 UNITS CAPS capsule Take 50,000 Units by mouth every 7 (seven) days.  Marland Kitchen. dicyclomine (BENTYL) 10 MG capsule Take 1 capsule (10 mg total) by mouth 4 (four) times daily -  before meals and at bedtime.  . [DISCONTINUED] docusate sodium (COLACE) 100 MG capsule Take 100 mg by mouth 2 (two) times daily.  . [DISCONTINUED] ferrous sulfate 325 (65 FE) MG tablet Take 324  mg by mouth daily with breakfast.  . [DISCONTINUED] furosemide (LASIX) 80 MG tablet Take 80 mg by mouth.  . [DISCONTINUED] gabapentin (NEURONTIN) 600 MG tablet Take 600 mg by mouth 2 (two) times daily.  . [DISCONTINUED] glipiZIDE (GLUCOTROL) 10 MG tablet Take 10 mg by mouth daily before breakfast.  . [DISCONTINUED] lisinopril-hydrochlorothiazide (PRINZIDE,ZESTORETIC) 10-12.5 MG per tablet Take 1 tablet by mouth daily.  . [DISCONTINUED] loratadine (CLARITIN) 10 MG tablet Take 10 mg by mouth daily.  . [DISCONTINUED] Naftifine HCl (NAFTIN) 2 % CREA Apply 1 application topically 1 day or 1 dose. (Patient not taking: Reported on 04/25/2015)  . [DISCONTINUED] Oxycodone HCl 20 MG TABS Take 1 tablet by mouth every 8 (eight) hours as needed.  . [DISCONTINUED] pravastatin (PRAVACHOL) 20 MG tablet Take 20 mg by mouth daily.  . [DISCONTINUED] solifenacin (VESICARE) 10 MG tablet Take by mouth daily.   No facility-administered encounter medications on file as of 10/30/2018.    Allergies  Allergen Reactions  . Levaquin [Levofloxacin In D5w]     Throat closes up.  Marland Kitchen. Zithromax [Azithromycin]     Causes a rash  . Prednisone Other (See Comments)    Hypotension   There are no active problems to display for this patient.  Social History   Socioeconomic History  . Marital status: Married    Spouse name: Not on file  . Number of children: 3  . Years of education: Not on file  . Highest education level: Not on file  Occupational History  . Not on file  Social Needs  . Financial resource strain: Not on file  . Food insecurity:    Worry: Not on file    Inability: Not on file  . Transportation needs:    Medical: Not on file    Non-medical: Not on file  Tobacco Use  . Smoking status: Former Games developermoker  . Smokeless tobacco: Never Used  . Tobacco comment: quit 35 years  Substance and Sexual Activity  . Alcohol use: No    Alcohol/week: 0.0 standard drinks  . Drug use: No  . Sexual activity: Not on file    Lifestyle  . Physical activity:    Days per week: Not on file    Minutes per session: Not on file  . Stress: Not on file  Relationships  . Social connections:    Talks on phone: Not on file    Gets together: Not on file    Attends religious service: Not on file    Active member of club or organization: Not on file    Attends meetings of clubs or organizations: Not on file    Relationship status: Not on file  . Intimate partner violence:    Fear of current or ex partner: Not on file    Emotionally abused: Not on file    Physically abused: Not on file    Forced sexual activity: Not on file  Other Topics Concern  . Not on file  Social History Narrative  . Not on file  Ms. Lillia DallasFreeland's family history includes Colon cancer in her father; Gout in her sister; Other in her mother; Peripheral vascular disease in her brother and sister; Prostate cancer in her brother.      Objective:    Vitals:   10/30/18 0912  BP: 132/68  Pulse: 64    Physical Exam; well-developed elderly African-American female in no acute distress, pleasant, height 5 foot 1, weight 142, BMI 26.9.  Weight is down 19 pounds from last office visit in November 2019. HEENT; nontraumatic normocephalic EOMI PERRLA sclera anicteric oral mucosa moist, Cardiovascular; regular rate and rhythm with S1-S2, Pulmonary clear bilaterally, Abdomen; soft, sounds are present no palpable mass or hepatosplenomegaly, she does have tenderness in the left lower and left mid quadrant no guarding or rebound.  Rectal; exam not done today-patient showed me a picture of a large blood clot that she passed yesterday and her commode..  Extremities ;no clubbing cyanosis or edema skin warm dry, Neuropsych ;alert and oriented, grossly nonfocal mood and affect appropriate       Assessment & Plan:   #51 73 year old African-American female with 4 to 1165-month history of progressive diarrhea, of unclear etiology.  Associated weight loss of 20 pounds   Patient had an episode yesterday of significant abdominal cramping and then passage of blood clot x2.  Colonoscopy November 2019 with no evidence of colitis, noted to have sigmoid diverticulosis and a 4 mm polyp was removed from the ascending colon.  No random biopsies done  As patient has had at least one course of antibiotics since prior stool studies in September will need to rule out infectious etiologies i.e. C. difficile.  Rule out new onset IBD, rule out microscopic colitis.  Rule out medication induced diarrhea (Aricept or Dexilant)  Patient may have had an episode of mild ischemic colitis yesterday.  2 GERD 3.  Adult onset diabetes mellitus 4.  Hypertension 5.  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  Plan; CBC with differential, CMet, sed rate CRP, stool for,lactoferrin, GI path panel and fecal elastase  Schedule for CT of the abdomen pelvis with contrast Patient will hold Aricept Hold Cipro and Flagyl which were prescribed yesterday by PCP Start dicyclomine 10 mg p.o. every 6 hours Start Imodium 1 p.o. up to 4 times daily. Further plans pending results of above.  If all stool studies are unremarkable and diarrhea continues off Aricept she may need repeat flex versus colonoscopy with random biopsies to assess for microscopic colitis.      Amy S Esterwood PA-C 10/30/2018   Cc: Shelbie AmmonsHaque, Imran P, MD

## 2018-10-30 NOTE — Progress Notes (Signed)
Thanks Amy Agree with the plan

## 2018-10-31 ENCOUNTER — Ambulatory Visit (INDEPENDENT_AMBULATORY_CARE_PROVIDER_SITE_OTHER)
Admission: RE | Admit: 2018-10-31 | Discharge: 2018-10-31 | Disposition: A | Discharge status: Home / Self Care | Payer: Medicare Other | Source: Ambulatory Visit | Attending: Physician Assistant | Admitting: Physician Assistant

## 2018-10-31 DIAGNOSIS — K625 Hemorrhage of anus and rectum: Secondary | ICD-10-CM

## 2018-10-31 DIAGNOSIS — R197 Diarrhea, unspecified: Secondary | ICD-10-CM

## 2018-10-31 DIAGNOSIS — R634 Abnormal weight loss: Secondary | ICD-10-CM | POA: Diagnosis not present

## 2018-10-31 DIAGNOSIS — R109 Unspecified abdominal pain: Secondary | ICD-10-CM

## 2018-10-31 MED ORDER — IOPAMIDOL (ISOVUE-300) INJECTION 61%
100.0000 mL | Freq: Once | INTRAVENOUS | Status: AC | PRN
  Administered 2018-10-31: 100 mL via INTRAVENOUS

## 2018-11-04 ENCOUNTER — Telehealth: Payer: Self-pay | Admitting: Physician Assistant

## 2018-11-04 NOTE — Telephone Encounter (Signed)
Pt called inquiring about ct scan results, pt is requesting a call on her husband's cell if she does not answer hers.

## 2018-11-04 NOTE — Telephone Encounter (Signed)
Advised the results have not been reviewed.

## 2018-11-10 ENCOUNTER — Telehealth: Payer: Self-pay | Admitting: Gastroenterology

## 2018-11-10 NOTE — Telephone Encounter (Signed)
Pt called in stated that she is returning call she assumed she was called for her CT lab results.she will like a call back on [phone] which Is her husband cell number.

## 2018-11-11 LAB — FECAL LACTOFERRIN, QUANT
Fecal Lactoferrin: POSITIVE — AB
MICRO NUMBER:: 95450
SPECIMEN QUALITY:: ADEQUATE

## 2018-11-11 LAB — PANCREATIC ELASTASE, FECAL: Pancreatic Elastase-1, Stool: 389 mcg/g

## 2018-11-11 LAB — GASTROINTESTINAL PATHOGEN PANEL PCR
C. difficile Tox A/B, PCR: NOT DETECTED
Campylobacter, PCR: NOT DETECTED
Cryptosporidium, PCR: NOT DETECTED
E coli (ETEC) LT/ST PCR: NOT DETECTED
E coli (STEC) stx1/stx2, PCR: NOT DETECTED
E coli 0157, PCR: NOT DETECTED
Giardia lamblia, PCR: NOT DETECTED
Norovirus, PCR: NOT DETECTED
Rotavirus A, PCR: NOT DETECTED
Salmonella, PCR: NOT DETECTED
Shigella, PCR: NOT DETECTED

## 2018-11-11 LAB — CLOSTRIDIUM DIFFICILE TOXIN B, QUALITATIVE, REAL-TIME PCR: Toxigenic C. Difficile by PCR: NOT DETECTED

## 2018-11-11 NOTE — Telephone Encounter (Signed)
Please review additional charting for this patient-patient was sent a letter with information;

## 2019-01-08 ENCOUNTER — Ambulatory Visit: Payer: Medicare Other | Admitting: Gastroenterology

## 2019-01-20 ENCOUNTER — Ambulatory Visit: Payer: Medicare Other | Admitting: Neurology

## 2019-01-23 ENCOUNTER — Encounter: Payer: Medicare Other | Admitting: Neurology

## 2019-05-12 IMAGING — CT CT ABD-PELV W/ CM
2 of 5 series · 16 of 46 positions shown, 18 images · IV contrast (ISOVUE 300)
Comparison: 06/25/2018

CLINICAL DATA: [DATE] month history of progressive diarrhea of unclear
etiology.

EXAM:
CT ABDOMEN AND PELVIS WITH CONTRAST
TECHNIQUE: Multidetector CT imaging of the abdomen and pelvis was performed
using the standard protocol following bolus administration of
intravenous contrast.
CONTRAST:  100mL PCVDXO-G33 IOPAMIDOL (PCVDXO-G33) INJECTION 61%

[Series 2: abd/(person_name) (person_name) · axial · 0.68mm/px · z∈[+934,+1284]mm · 13 of 80 slices shown, 15 images]
[im 5/80  soft-tissue]
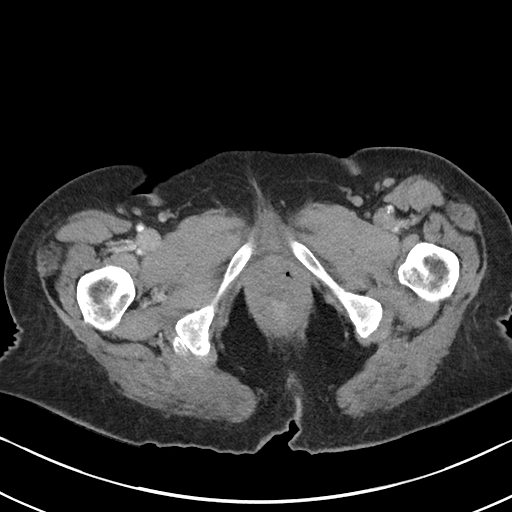
[im 5/80  bone]
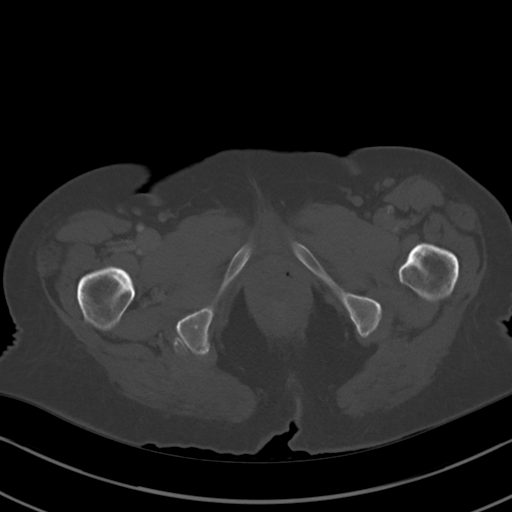
[im 13/80  soft-tissue]
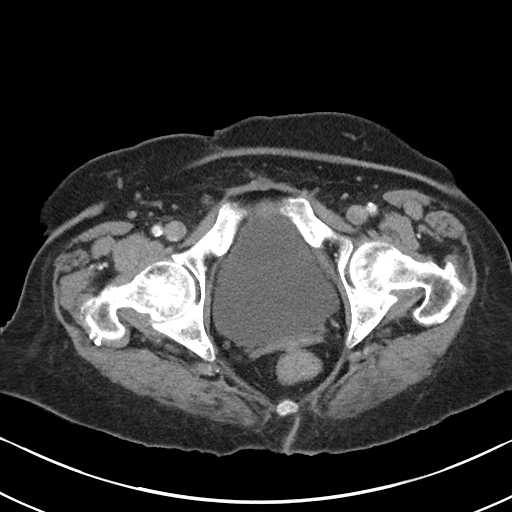
[im 17/80  soft-tissue]
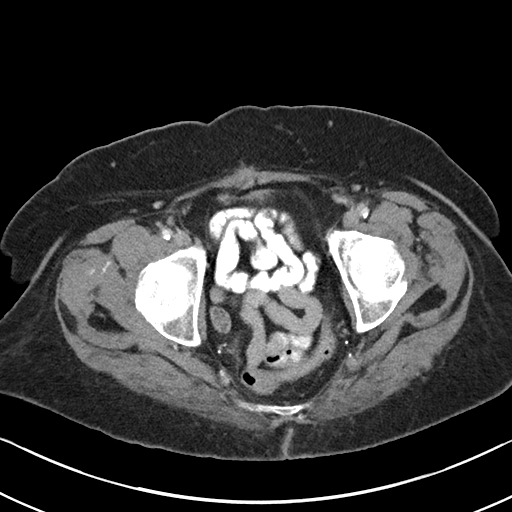
[im 21/80  soft-tissue]
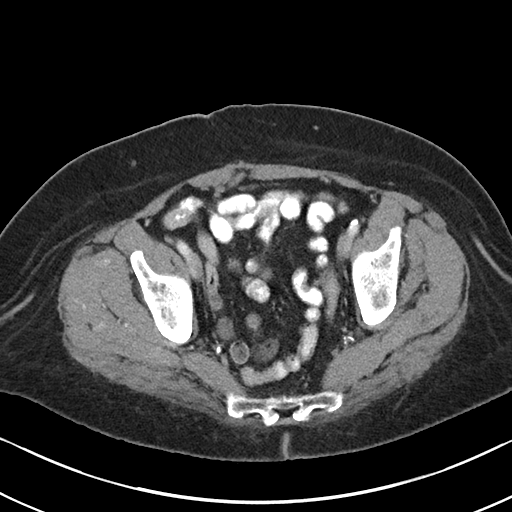
[im 30/80  soft-tissue]
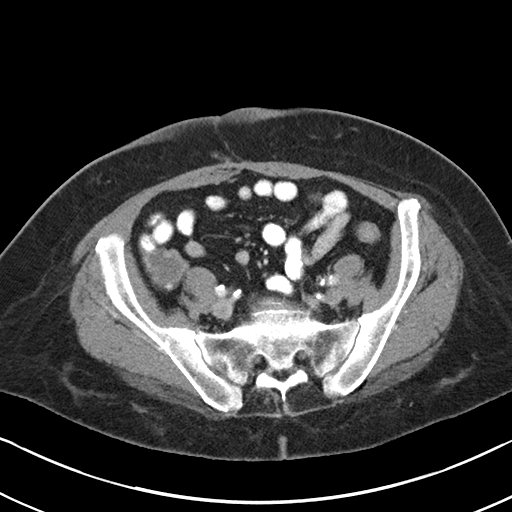
[im 34/80  soft-tissue]
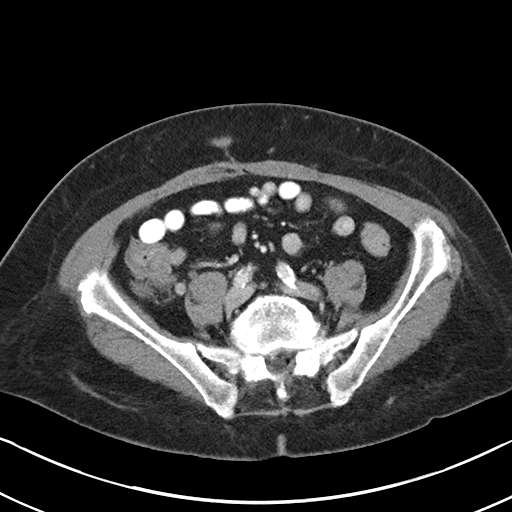
[im 42/80  soft-tissue]
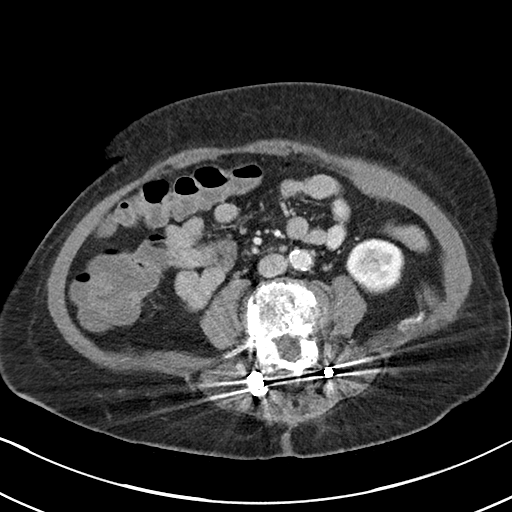
[im 46/80  soft-tissue]
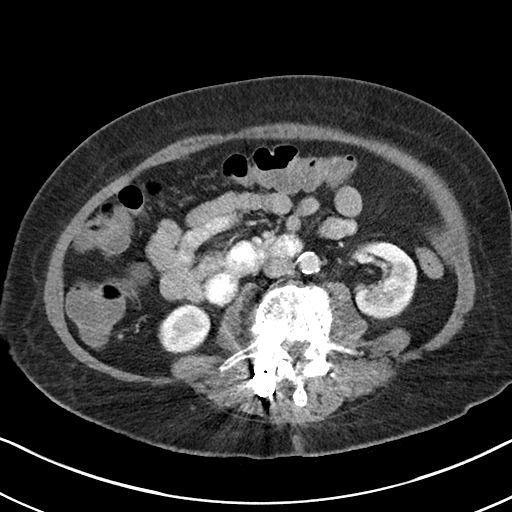
[im 50/80  soft-tissue]
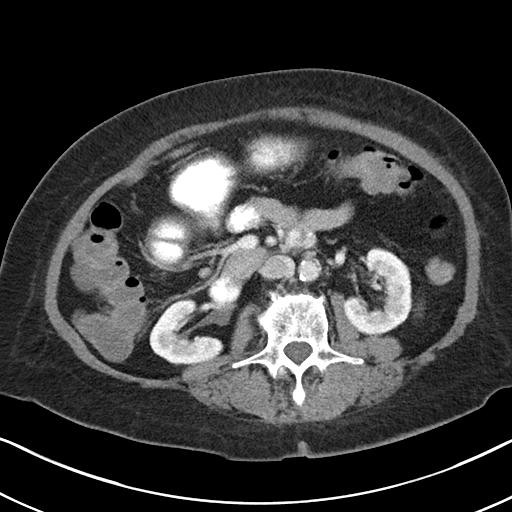
[im 50/80  bone]
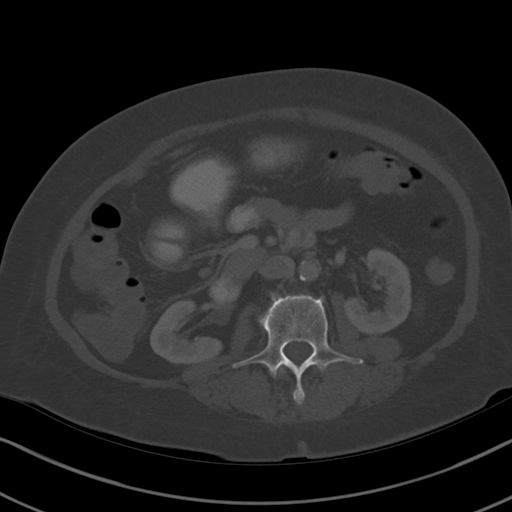
[im 59/80  soft-tissue]
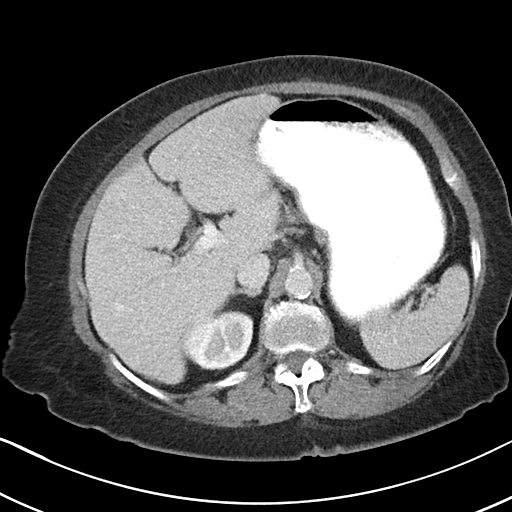
[im 63/80  soft-tissue]
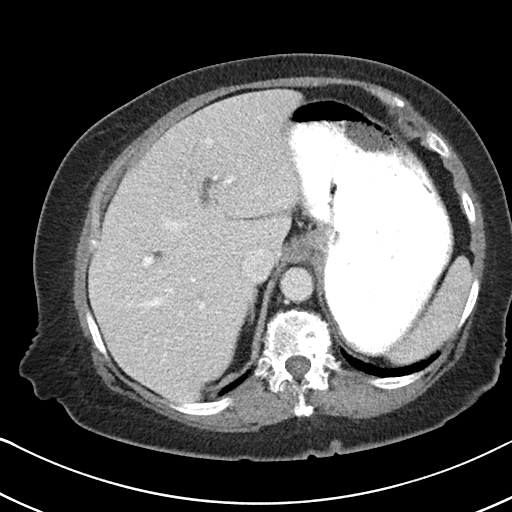
[im 67/80  soft-tissue]
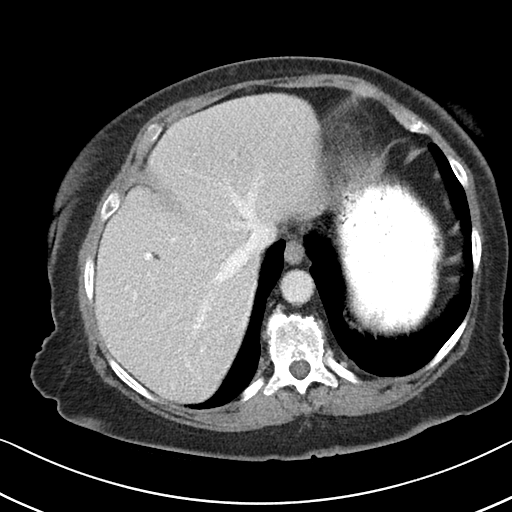
[im 75/80  soft-tissue]
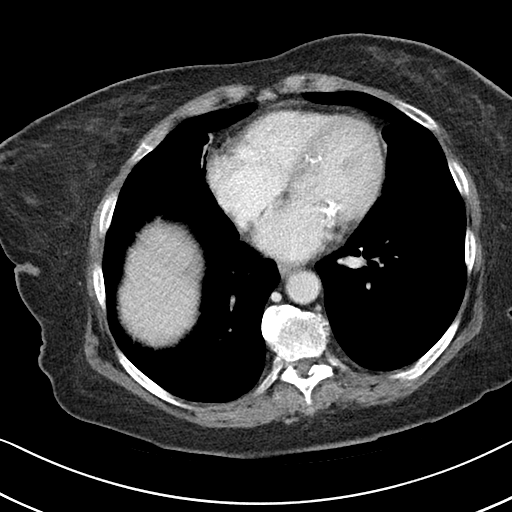

[Series 5: abd/pel w st · coronal · 0.74mm/px · 3 of 92 slices shown]
[im 31/92  soft-tissue]
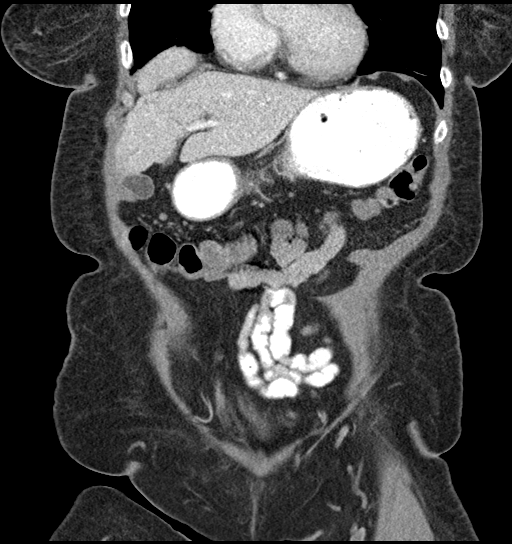
[im 41/92  soft-tissue]
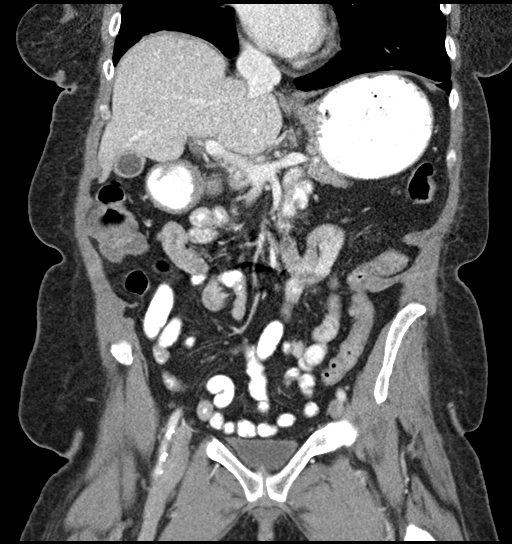
[im 51/92  soft-tissue]
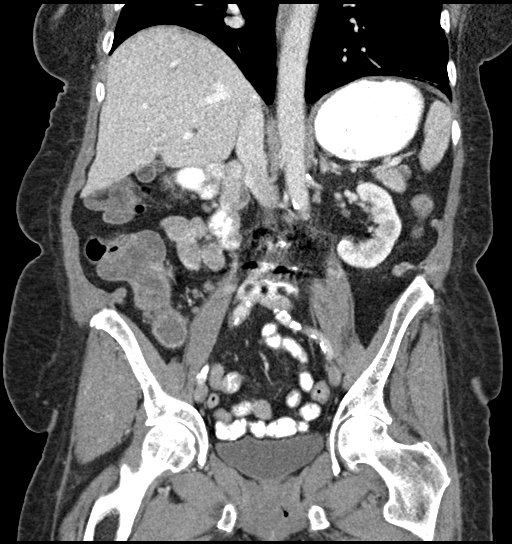

[16 of 46 positions shown; findings below may reference images not displayed]

FINDINGS: Lower chest: No acute abnormality.

Hepatobiliary: No focal liver abnormality is seen. No gallstones,
gallbladder wall thickening, or biliary dilatation.

Pancreas: Unremarkable. No pancreatic ductal dilatation or
surrounding inflammatory changes.

Spleen: Normal in size without focal abnormality.

Adrenals/Urinary Tract: Normal appearance of the adrenal glands. The
kidneys are unremarkable. No mass or hydronephrosis. Urinary bladder
appears normal.

Stomach/Bowel: Stomach is within normal limits. Previous
appendectomy. No evidence of bowel wall thickening, distention, or
inflammatory changes.

Vascular/Lymphatic: Aortic atherosclerosis. No aneurysm. No
retroperitoneal or pelvic adenopathy. Within the right lower
quadrant of the abdomen there are several prominent ileocolic lymph
nodes. Index lymph node measures 0.9 cm, image 45/2. Previously this
measured 0.7 cm. Adjacent lymph node measures 0.9 cm, image 43/2.
Previously 0.7 cm. No pelvic or inguinal adenopathy.

Reproductive: Status post hysterectomy. No adnexal masses.

Other: No free fluid, fluid collections or inflammatory change
identified within the abdomen or pelvis.

Musculoskeletal: Spondylosis identified within the lumbar spine.
Previous posterior hardware fixation of L3-4 and interbody fusion of
L3-4 and L4-5. Anterolisthesis of L4 on L5 measures 8 mm. Marked
degenerative disc disease identified at L2-3 and L5-S1.
IMPRESSION: 1. No acute findings within the abdomen or pelvis. No evidence for
colitis or complications of colitis identified.
2.  Aortic Atherosclerosis ([id number].I).
3. Prominent right lower quadrant ileocolic lymph nodes are upper
limits of normal in size measuring 9 mm and slightly increased in
size from 06/23/2018.
4. Advanced lumbar spondylosis noted.

## 2022-11-11 LAB — COLOGUARD: COLOGUARD: POSITIVE — AB

## 2022-12-04 ENCOUNTER — Telehealth: Payer: Self-pay | Admitting: Gastroenterology

## 2022-12-04 NOTE — Telephone Encounter (Signed)
Inbound call from patient , wanted to know if she can have a colonoscopy done sooner than her recall date .Marland KitchenShe took a colonguard and it was blood in her stool .Please advise

## 2022-12-04 NOTE — Telephone Encounter (Signed)
Left message for pt to call back  °

## 2022-12-05 NOTE — Telephone Encounter (Signed)
Pt stated that she had a positive cologuard done by her PCP Dr. London Pepper. Pt stated that she has not been seeing any blood in her stool. Dr. London Pepper requested her to have colonoscopy be done sooner. Pt was given an office visit to see Dr. Lyndel Safe on 12/14/2022 at 1:30 PM. Pt made aware.  Pt verbalized understanding with all questions answered.

## 2022-12-14 ENCOUNTER — Other Ambulatory Visit (INDEPENDENT_AMBULATORY_CARE_PROVIDER_SITE_OTHER): Payer: 59

## 2022-12-14 ENCOUNTER — Ambulatory Visit (INDEPENDENT_AMBULATORY_CARE_PROVIDER_SITE_OTHER): Payer: 59 | Admitting: Gastroenterology

## 2022-12-14 ENCOUNTER — Encounter: Payer: Self-pay | Admitting: Gastroenterology

## 2022-12-14 VITALS — BP 138/78 | HR 71 | Ht 65.0 in | Wt 173.1 lb

## 2022-12-14 DIAGNOSIS — R1031 Right lower quadrant pain: Secondary | ICD-10-CM

## 2022-12-14 DIAGNOSIS — K219 Gastro-esophageal reflux disease without esophagitis: Secondary | ICD-10-CM

## 2022-12-14 DIAGNOSIS — R110 Nausea: Secondary | ICD-10-CM | POA: Diagnosis not present

## 2022-12-14 DIAGNOSIS — K5909 Other constipation: Secondary | ICD-10-CM

## 2022-12-14 DIAGNOSIS — R195 Other fecal abnormalities: Secondary | ICD-10-CM

## 2022-12-14 DIAGNOSIS — Z800 Family history of malignant neoplasm of digestive organs: Secondary | ICD-10-CM

## 2022-12-14 LAB — CBC WITH DIFFERENTIAL/PLATELET
Basophils Absolute: 0.1 10*3/uL (ref 0.0–0.1)
Basophils Relative: 0.6 % (ref 0.0–3.0)
Eosinophils Absolute: 0.1 10*3/uL (ref 0.0–0.7)
Eosinophils Relative: 0.9 % (ref 0.0–5.0)
HCT: 38.4 % (ref 36.0–46.0)
Hemoglobin: 12.5 g/dL (ref 12.0–15.0)
Lymphocytes Relative: 17.2 % (ref 12.0–46.0)
Lymphs Abs: 2.4 10*3/uL (ref 0.7–4.0)
MCHC: 32.4 g/dL (ref 30.0–36.0)
MCV: 87 fl (ref 78.0–100.0)
Monocytes Absolute: 0.7 10*3/uL (ref 0.1–1.0)
Monocytes Relative: 5.4 % (ref 3.0–12.0)
Neutro Abs: 10.4 10*3/uL — ABNORMAL HIGH (ref 1.4–7.7)
Neutrophils Relative %: 75.9 % (ref 43.0–77.0)
Platelets: 223 10*3/uL (ref 150.0–400.0)
RBC: 4.41 Mil/uL (ref 3.87–5.11)
RDW: 14.6 % (ref 11.5–15.5)
WBC: 13.7 10*3/uL — ABNORMAL HIGH (ref 4.0–10.5)

## 2022-12-14 LAB — COMPREHENSIVE METABOLIC PANEL
ALT: 20 U/L (ref 0–35)
AST: 23 U/L (ref 0–37)
Albumin: 4.3 g/dL (ref 3.5–5.2)
Alkaline Phosphatase: 69 U/L (ref 39–117)
BUN: 19 mg/dL (ref 6–23)
CO2: 29 mEq/L (ref 19–32)
Calcium: 10 mg/dL (ref 8.4–10.5)
Chloride: 103 mEq/L (ref 96–112)
Creatinine, Ser: 1.04 mg/dL (ref 0.40–1.20)
GFR: 52.83 mL/min — ABNORMAL LOW (ref >60.00)
Glucose, Bld: 89 mg/dL (ref 70–99)
Potassium: 4.3 mEq/L (ref 3.5–5.1)
Sodium: 141 mEq/L (ref 135–145)
Total Bilirubin: 0.3 mg/dL (ref 0.2–1.2)
Total Protein: 7.3 g/dL (ref 6.0–8.3)

## 2022-12-14 NOTE — Progress Notes (Signed)
Chief Complaint: Abdominal pain  Referring Provider:  Bonnita Nasuti, MD      ASSESSMENT AND PLAN;   #1. RLQ pain #2. Chronic constipation (OIC). H/O diarrhea #3. Am Nausea likely d/t narcotics. #4. + cologuard. Prev colon 08/2018 was limited d/t prep. #5. FH colon cancer (dad at age 74) #62. GERD on dexilant.  Plan: -CBC, CMP -CT AP with contrast -Miralax 17g po QD -Colon therafter with 2 day prep -Minimize narcotics.   Discussed risks & benefits of colonoscopy. Risks including rare perforation req laparotomy, bleeding after bx/polypectomy req blood transfusion, rarely missing neoplasms, risks of anesthesia/sedation, rare risk of damage to internal organs. Benefits outweigh the risks. Patient agrees to proceed. All the questions were answered. Pt consents to proceed. HPI:    Frances Newman is a 77 y.o. female  With DM2, COPD (not on O2), HTN, HLD, generalized OA, anxiety/depression/insomnia, anemia of chronic disease  Arthritis on oxy 20 BID for pain control  Has been constipated ever since.  Previous history of diarrhea. With right lower quadrant abdominal pain-moderate to severe Also with nausea but no vomiting.  Had positive Cologuard  Advised colonoscopy.  She is a very poor historian. She has been having intermittent occasional rectal bleeding.  Heartburn is under good control on Dexilant.  No odynophagia or dysphagia.  No weight loss.  Has gained weight as below.   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  Biopsies were negative -Colonoscopy 08/2018- ([facility])Colonic polyp status post polypectomy. - Mild sigmoid diverticulosis - Non-bleeding external and internal hemorrhoids.  Fair prep. -Colonoscopy 10/20/2010 (adult) mild sigmoid  diverticulosis, small internal hemorrhoids. -Upper GI with small bowel follow-through 01/29/2017 normal -Capsule endoscopy 06/2006 normal, incidental lymphangiectasia  Past Medical History:  Diagnosis Date   Arthritis    Asthma    Cough    in the past   Diabetes mellitus without complication (HCC)    diet controlled/ meds were stopped   Diarrhea    Gall bladder disease    GERD (gastroesophageal reflux disease)    Hypertension    Sinus problem    Swelling     Past Surgical History:  Procedure Laterality Date   ANTERIOR CRUCIATE LIGAMENT REPAIR  2004   right replaced   [place]     2 back surgeries   CHOLECYSTECTOMY  2015   COLONOSCOPY  10/20/2010   Mild sigmoid diverticulosis. Small internal hemorrhoids.    ESOPHAGOGASTRODUODENOSCOPY  02/21/2017   Presbyesophagus status esophageal dilataion. Small antral polyps polypectomy.    FOOT SURGERY     FOOT SURGERY     Bil   HAND SURGERY     Had CTR on both hands/ 2 surgeries on right hand   kidney stones  2006   KNEE SURGERY     OVARY SURGERY     SHOULDER SURGERY     Bil shoulder surgery   TONSILLECTOMY  1966   TOTAL ABDOMINAL HYSTERECTOMY      Family History  Problem Relation Age of Onset   Other Mother    Colon cancer Father    Gout Sister    Peripheral vascular disease Brother    Prostate cancer Brother    Peripheral vascular disease Sister  Social History   Tobacco Use   Smoking status: Former   Smokeless tobacco: Never   Tobacco comments:    quit 35 years  Vaping Use   Vaping Use: Never used  Substance Use Topics   Alcohol use: No    Alcohol/week: 0.0 standard drinks of alcohol   Drug use: No    Current Outpatient Medications  Medication Sig Dispense Refill   albuterol (VENTOLIN HFA) 108 (90 Base) MCG/ACT inhaler Inhale 2 puffs into the lungs every 4 (four) hours as needed for wheezing or shortness of breath.     ALPRAZolam (XANAX) 0.25 MG tablet Take 0.25 mg by mouth  daily as needed for anxiety.     aspirin 81 MG tablet Take by mouth daily.     dexlansoprazole (DEXILANT) 60 MG capsule Take 1 capsule (60 mg total) by mouth daily. 30 capsule 4   diclofenac Sodium (VOLTAREN) 1 % GEL Apply 4 g topically 2 (two) times daily.     dicyclomine (BENTYL) 10 MG capsule Take 1 capsule (10 mg total) by mouth 4 (four) times daily -  before meals and at bedtime. 120 capsule 1   donepezil (ARICEPT) 5 MG tablet Take 5 mg by mouth at bedtime.     escitalopram (LEXAPRO) 10 MG tablet Take 10 mg by mouth daily.     ferrous sulfate 325 (65 FE) MG EC tablet Take 325 mg by mouth daily.     fluticasone (FLONASE) 50 MCG/ACT nasal spray Place 1 spray into both nostrils daily.     hydrOXYzine (ATARAX) 10 MG tablet Take 10 mg by mouth 2 (two) times daily as needed for anxiety.     Lemborexant (DAYVIGO) 10 MG TABS Take 1 tablet by mouth at bedtime.     loratadine (CLARITIN) 10 MG tablet Take 10 mg by mouth daily.     metoprolol tartrate (LOPRESSOR) 25 MG tablet Take 12.5 mg by mouth 2 (two) times daily.      montelukast (SINGULAIR) 10 MG tablet Take 10 mg by mouth daily.     nitroGLYCERIN (NITROSTAT) 0.4 MG SL tablet Place 0.4 mg under the tongue every 5 (five) minutes as needed for chest pain. If no relief after 3 doses call 911     nystatin powder Apply 1 Application topically 2 (two) times daily.     Oxycodone HCl 20 MG TABS Take 1 tablet by mouth every 8 (eight) hours as needed.     potassium chloride (MICRO-K) 10 MEQ CR capsule Take 10 mEq by mouth 2 (two) times daily.     potassium citrate (UROCIT-K) 10 MEQ (1080 MG) SR tablet Take 10 mEq by mouth daily.     revefenacin (YUPELRI) 175 MCG/3ML nebulizer solution Take 175 mcg by nebulization 3 (three) times daily as needed.     rosuvastatin (CRESTOR) 10 MG tablet Take 10 mg by mouth daily.     Vitamin D, Ergocalciferol, (DRISDOL) 1.25 MG (50000 UNIT) CAPS capsule Take 50,000 Units by mouth every 7 (seven) days.      cloNIDine-chlorthalidone (CLORPRES) 0.1-15 MG tablet Take 0.5 tablets by mouth 2 (two) times daily.     No current facility-administered medications for this visit.    Allergies  Allergen Reactions   Levaquin [Levofloxacin In D5w]     Throat closes up.   Zithromax [Azithromycin]     Causes a rash   Prednisone Other (See Comments)    Hypotension    Review of Systems:  neg     Physical Exam:  BP 138/78   Pulse 71   Ht '5\' 5"'$  (1.651 m)   Wt 173 lb 2 oz (78.5 kg)   LMP  (LMP Unknown)   BMI 28.81 kg/m  Filed Weights   12/14/22 1330  Weight: 173 lb 2 oz (78.5 kg)   Constitutional:  Well-developed, in no acute distress. Psychiatric: Normal mood and affect. Behavior is normal. Cardiovascular: Normal rate, regular rhythm. No edema Pulmonary/chest: Effort normal and breath sounds normal. No wheezing, rales or rhonchi. Abdominal: Soft, nondistended.  Mild lower abdominal tenderness. Bowel sounds active throughout. There are no masses palpable. No hepatomegaly. Neurological: Alert and oriented to person place and time. Skin: Skin is warm and dry. No rashes noted.  Data Reviewed: I have personally reviewed following labs and imaging studies    Latest Ref Rng & Units 12/14/2022    2:45 PM 10/30/2018   10:10 AM 05/19/2010    9:04 AM  CBC  WBC 4.0 - 10.5 K/uL 13.7  8.3  10.0   Hemoglobin 12.0 - 15.0 g/dL 12.5  10.9  11.6   Hematocrit 36.0 - 46.0 % 38.4  34.3  36.7   Platelets 150.0 - 400.0 K/uL 223.0  353.0  239       Latest Ref Rng & Units 12/14/2022    2:45 PM 10/30/2018   10:10 AM 05/19/2010    9:04 AM  CMP  Glucose 70 - 99 mg/dL 89  100  101   BUN 6 - 23 mg/dL '19  20  21   '$ Creatinine 0.40 - 1.20 mg/dL 1.04  1.21  0.94   Sodium 135 - 145 mEq/L 141  138  144   Potassium 3.5 - 5.1 mEq/L 4.3  4.5  5.3   Chloride 96 - 112 mEq/L 103  111  110   CO2 19 - 32 mEq/L '29  18  28   '$ Calcium 8.4 - 10.5 mg/dL 10.0  8.8  9.6   Total Protein 6.0 - 8.3 g/dL 7.3  5.6    Total Bilirubin  0.2 - 1.2 mg/dL 0.3  0.3    Alkaline Phos 39 - 117 U/L 69  61    AST 0 - 37 U/L 23  11    ALT 0 - 35 U/L 20  7       Carmell Austria, MD 12/14/2022, 2:05 PM  Cc: Bonnita Nasuti, MD

## 2022-12-14 NOTE — Patient Instructions (Addendum)
_______________________________________________________  If your blood pressure at your visit was 140/90 or greater, please contact your primary care physician to follow up on this.  _______________________________________________________  If you are age 75 or older, your body mass index should be between 23-30. Your Body mass index is 28.81 kg/m. If this is out of the aforementioned range listed, please consider follow up with your Primary Care Provider.  If you are age 54 or younger, your body mass index should be between 19-25. Your Body mass index is 28.81 kg/m. If this is out of the aformentioned range listed, please consider follow up with your Primary Care Provider.   ________________________________________________________  The [facility] providers would like to encourage you to use [facility] to communicate with providers for non-urgent requests or questions.  Due to long hold times on the telephone, sending your provider a message by [facility] may be a faster and more efficient way to get a response.  Please allow 48 business hours for a response.  Please remember that this is for non-urgent requests.  _______________________________________________________  Your provider has requested that you go to the basement level for lab work before leaving today. Press "B" on the elevator. The lab is located at the first door on the left as you exit the elevator.  Please purchase the following medications over the counter and take as directed: Miralax 17g daily  Two days before your procedure: Mix 3 packs (or capfuls) of Miralax in 48 ounces of clear liquid and drink at 6pm.  Minimize narcotics  You have been scheduled for a colonoscopy. Please follow written instructions given to you at your visit today.  Please pick up your prep supplies at the pharmacy within the next 1-3 days. If you use inhalers (even only as needed), please bring them with you on the day of your procedure.  You have  [facility] [place] [address] 1[facility]   You are scheduled on 12-18-2022 at 5pm. You should arrive by 245pm prior to your appointment time for registration. Please follow the written instructions below on the day of your exam:  WARNING: IF YOU ARE ALLERGIC TO IODINE/X-RAY DYE, PLEASE NOTIFY RADIOLOGY IMMEDIATELY AT [phone]! YOU WILL BE GIVEN A 13 HOUR PREMEDICATION PREP.  1) Do not eat or drink anything after 1pm (4 hours prior to your test) 2) You will been given 2 bottles of oral contrast to drink on site. The solution may taste better if refrigerated, but do NOT add ice or any other liquid to this solution. Shake well before drinking.    Drink 1 bottle of contrast @ 3pm (2 hours prior to your exam)  Drink 1 bottle of contrast @ 4pm (1 hour prior to your exam)  You may take any medications as prescribed with a small amount of water, if necessary. If you take any of the following medications: METFORMIN, GLUCOPHAGE, GLUCOVANCE, AVANDAMET, RIOMET, FORTAMET, [place] MET, JANUMET, GLUMETZA or METAGLIP, you MAY be asked to HOLD this medication 48 hours AFTER the exam.  The purpose of you drinking the oral contrast is to aid in the visualization of your intestinal tract. The contrast solution may cause some diarrhea. Depending on your individual set of symptoms, you may also receive an intravenous injection of x-ray contrast/dye. Plan on being at [facility] for 30 minutes or longer, depending on the type of exam you are having performed.  This  test typically takes 30-45 minutes to complete.  If you have any questions regarding your exam or if you need to reschedule, you may call the CT department at [phone] between the hours of 8:00 am and 5:00 pm, Monday-Friday.  ________________________________________________________________________  Thank you,  Dr. Jackquline Denmark

## 2022-12-18 ENCOUNTER — Ambulatory Visit (HOSPITAL_BASED_OUTPATIENT_CLINIC_OR_DEPARTMENT_OTHER)
Admission: RE | Admit: 2022-12-18 | Discharge: 2022-12-18 | Disposition: A | Discharge status: Home / Self Care | Payer: 59 | Source: Ambulatory Visit | Attending: Gastroenterology | Admitting: Gastroenterology

## 2022-12-18 DIAGNOSIS — K219 Gastro-esophageal reflux disease without esophagitis: Secondary | ICD-10-CM | POA: Insufficient documentation

## 2022-12-18 DIAGNOSIS — R110 Nausea: Secondary | ICD-10-CM | POA: Insufficient documentation

## 2022-12-18 DIAGNOSIS — Z800 Family history of malignant neoplasm of digestive organs: Secondary | ICD-10-CM | POA: Diagnosis present

## 2022-12-18 DIAGNOSIS — R1031 Right lower quadrant pain: Secondary | ICD-10-CM

## 2022-12-18 DIAGNOSIS — R195 Other fecal abnormalities: Secondary | ICD-10-CM | POA: Diagnosis present

## 2022-12-18 DIAGNOSIS — K5909 Other constipation: Secondary | ICD-10-CM | POA: Insufficient documentation

## 2022-12-18 MED ORDER — IOHEXOL 300 MG/ML  SOLN
100.0000 mL | Freq: Once | INTRAMUSCULAR | Status: AC | PRN
  Administered 2022-12-18: 100 mL via INTRAVENOUS

## 2023-01-10 DIAGNOSIS — U071 COVID-19: Secondary | ICD-10-CM | POA: Diagnosis not present

## 2023-01-10 DIAGNOSIS — R062 Wheezing: Secondary | ICD-10-CM | POA: Diagnosis not present

## 2023-01-10 DIAGNOSIS — J449 Chronic obstructive pulmonary disease, unspecified: Secondary | ICD-10-CM | POA: Diagnosis not present

## 2023-01-10 DIAGNOSIS — R059 Cough, unspecified: Secondary | ICD-10-CM | POA: Diagnosis not present

## 2023-01-10 DIAGNOSIS — J069 Acute upper respiratory infection, unspecified: Secondary | ICD-10-CM | POA: Diagnosis not present

## 2023-01-14 DIAGNOSIS — J069 Acute upper respiratory infection, unspecified: Secondary | ICD-10-CM | POA: Diagnosis not present

## 2023-01-14 DIAGNOSIS — R0602 Shortness of breath: Secondary | ICD-10-CM | POA: Diagnosis not present

## 2023-01-19 DIAGNOSIS — M62572 Muscle wasting and atrophy, not elsewhere classified, left ankle and foot: Secondary | ICD-10-CM | POA: Diagnosis not present

## 2023-01-19 DIAGNOSIS — M62571 Muscle wasting and atrophy, not elsewhere classified, right ankle and foot: Secondary | ICD-10-CM | POA: Diagnosis not present

## 2023-01-21 ENCOUNTER — Ambulatory Visit: Payer: 59 | Admitting: Internal Medicine

## 2023-01-21 ENCOUNTER — Encounter: Payer: Self-pay | Admitting: Internal Medicine

## 2023-01-21 VITALS — BP 170/84 | HR 109 | Temp 97.3°F | Resp 16 | Ht 64.5 in | Wt 174.6 lb

## 2023-01-21 DIAGNOSIS — Z6829 Body mass index (BMI) 29.0-29.9, adult: Secondary | ICD-10-CM

## 2023-01-21 DIAGNOSIS — J302 Other seasonal allergic rhinitis: Secondary | ICD-10-CM

## 2023-01-21 DIAGNOSIS — G894 Chronic pain syndrome: Secondary | ICD-10-CM

## 2023-01-21 HISTORY — DX: Chronic pain syndrome: G89.4

## 2023-01-21 HISTORY — DX: Other seasonal allergic rhinitis: J30.2

## 2023-01-21 MED ORDER — ALPRAZOLAM 0.25 MG PO TABS: 0.2500 mg | ORAL_TABLET | Freq: Every day | ORAL | 2 refills | Status: DC | PRN

## 2023-01-21 MED ORDER — XTAMPZA ER 9 MG PO C12A: 1.0000 | EXTENDED_RELEASE_CAPSULE | Freq: Two times a day (BID) | ORAL | 0 refills | Status: DC

## 2023-01-21 MED ORDER — ONDANSETRON HCL 4 MG PO TABS: 4.0000 mg | ORAL_TABLET | Freq: Three times a day (TID) | ORAL | 0 refills | Status: DC | PRN

## 2023-01-21 NOTE — Progress Notes (Signed)
Office Visit  Subjective   Patient ID: Frances Newman   DOB: 1946-09-29   Age: 77 y.o.   MRN: [id number]   Chief Complaint Chief Complaint  Patient presents with   New Patient (Initial Visit)     History of Present Illness The patient is a 77  year old female who presents to establish care.  She states she started with respiratory tract symptoms which began 2 weeks ago ago. She was having chest congestion with cough productive of green yellow sputum.  She went to see Dr. Ardelle Park first and she was placed on a steriod shot and albuterol nebulization and obtain a CXR.  The patient tells me there was no pneumonia at that time.  This was not clearing up and she went to urgent care next and they told her it was coming from her head and her lungs were clear.  The patient was placed on a prednisone taper.  She has been taking flonase nasal spray but she stopped this when she was taking her prednisone.  Today, she has more white productive cough with congestion in her "neck" with SOB and wheezing at night, sinus congestion, white nasal discharge, with post nasal drip.  She does have nausea but she has seen Dr. Chales Abrahams and he believes this is due to her opiods (see below).  There is no fevers, chills, headache, vomiting, diarrhea. Alleviating factors: mucinex since last week. The patient's past medical history is notable for COPD/asthma and seasonal allergies.  She also comes in today to discuss her chronic pain syndrome.  She states she has problems with chronic pain for over the last 6-7 years.  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  She is now having right hip pain as well.  The patient states she was started on pain meds over 2 years ago and they have been going up on the dose to control  her pain.  She was on oxycodone 20mg  TID but she saw her previous PCP Dr. Ardelle Park a month ago and told him she wants to cut back on her meds and he decreased her down to oxycodone 20mg  BID.  She states the twice a day dosing is controlling her pain.  I have reviewed her MRI of her lumbar spine from 12/21/2015 and this showed new severe L2-L3 severe spinal stenosis and severe right and moderate left neural foraminal stenosis with postoperative changes at L3-L4 and L4-L5.  There is mild right and moderate left neural foraminal stenosis at L5-S1.  She states she did have an ESI done a few years ago in [place].  Her worst pain is her back pain that is an intermittent sharp pain and depends on what she is doing.  She states that her pain in her lower back can be a 7 out of 10 at times on the pain scale.  She states she has undergone physical therapy for her pain.  She denies any new weakness/numbness and no loss of bowel/bladder function.     Past Medical History Past Medical History:  Diagnosis Date   Arthritis    Asthma    DDD (degenerative disc disease), thoracic    Diabetes mellitus without complication (HCC)    diet controlled/ meds were stopped   Gall bladder disease  GERD (gastroesophageal reflux disease)    Hypertension    T2DM (type 2 diabetes mellitus) (HCC)      Allergies Allergies  Allergen Reactions   Levaquin [Levofloxacin In D5w]     Throat closes up.   Zithromax [Azithromycin]     Causes a rash   Prednisone Other (See Comments)    Hypotension     Medications  Current Outpatient Medications:    ondansetron (ZOFRAN) 4 MG tablet, Take 1 tablet (4 mg total) by mouth every 8 (eight) hours as needed for nausea or vomiting., Disp: 20 tablet, Rfl: 0   oxyCODONE ER (XTAMPZA ER) 9 MG C12A, Take 1 capsule by mouth in the morning and at bedtime., Disp: 60 capsule, Rfl: 0   albuterol (VENTOLIN HFA) 108 (90 Base) MCG/ACT inhaler, Inhale 2 puffs into the lungs every 4 (four) hours as  needed for wheezing or shortness of breath., Disp: , Rfl:    ALPRAZolam (XANAX) 0.25 MG tablet, Take 1 tablet (0.25 mg total) by mouth daily as needed for anxiety., Disp: 20 tablet, Rfl: 2   aspirin 81 MG tablet, Take by mouth daily., Disp: , Rfl:    Blood Glucose Monitoring Suppl (ACCU-CHEK GUIDE) w/Device KIT, by Does not apply route., Disp: , Rfl:    dexlansoprazole (DEXILANT) 60 MG capsule, Take 1 capsule (60 mg total) by mouth daily., Disp: 30 capsule, Rfl: 4   diclofenac Sodium (VOLTAREN) 1 % GEL, Apply 4 g topically 2 (two) times daily., Disp: , Rfl:    donepezil (ARICEPT) 5 MG tablet, Take 5 mg by mouth at bedtime., Disp: , Rfl:    doxycycline (MONODOX) 100 MG capsule, Take 100 mg by mouth 2 (two) times daily., Disp: , Rfl:    escitalopram (LEXAPRO) 10 MG tablet, TAKE 1 TABLET BY MOUTH DAILY, Disp: 30 tablet, Rfl: 3   ferrous sulfate 325 (65 FE) MG EC tablet, Take 325 mg by mouth daily., Disp: , Rfl:    fluticasone (FLONASE) 50 MCG/ACT nasal spray, Place 1 spray into both nostrils daily., Disp: , Rfl:    hydrOXYzine (ATARAX) 10 MG tablet, Take 10 mg by mouth 2 (two) times daily as needed for anxiety., Disp: , Rfl:    loratadine (CLARITIN) 10 MG tablet, Take 10 mg by mouth daily., Disp: , Rfl:    metoprolol tartrate (LOPRESSOR) 25 MG tablet, Take 12.5 mg by mouth 2 (two) times daily. , Disp: , Rfl:    montelukast (SINGULAIR) 10 MG tablet, Take 10 mg by mouth daily., Disp: , Rfl:    nitroGLYCERIN (NITROSTAT) 0.4 MG SL tablet, Place 0.4 mg under the tongue every 5 (five) minutes as needed for chest pain. If no relief after 3 doses call 911, Disp: , Rfl:    potassium citrate (UROCIT-K) 10 MEQ (1080 MG) SR tablet, Take 10 mEq by mouth daily., Disp: , Rfl:    revefenacin (YUPELRI) 175 MCG/3ML nebulizer solution, Take 175 mcg by nebulization 3 (three) times daily as needed., Disp: , Rfl:    rosuvastatin (CRESTOR) 10 MG tablet, TAKE 1 TABLET ONCE A DAY, Disp: 90 tablet, Rfl: 0   Vitamin D,  Ergocalciferol, (DRISDOL) 1.25 MG (50000 UNIT) CAPS capsule, TAKE 1 CAPSULE ONCE A WEEK, Disp: 12 capsule, Rfl: 0   Review of Systems Review of Systems  Constitutional:  Negative for chills and fever.  Respiratory:  Positive for cough, shortness of breath and wheezing. Negative for sputum production.   Cardiovascular:  Negative for chest pain, palpitations and leg swelling.  Gastrointestinal:  Positive for nausea. Negative for abdominal pain, constipation, diarrhea and vomiting.  Musculoskeletal:  Negative for myalgias.  Neurological:  Negative for dizziness, weakness and headaches.       Objective:    Vitals BP (!) 170/84   Pulse (!) 109   Temp (!) 97.3 F (36.3 C)   Resp 16   Ht 5' 4.5" (1.638 m)   Wt 174 lb 9.6 oz (79.2 kg)   LMP  (LMP Unknown)   SpO2 97%   BMI 29.51 kg/m    Physical Examination Physical Exam Constitutional:      Appearance: Normal appearance. She is not ill-appearing.  Cardiovascular:     Rate and Rhythm: Normal rate and regular rhythm.     Pulses: Normal pulses.     Heart sounds: No murmur heard.    No friction rub. No gallop.  Pulmonary:     Effort: Pulmonary effort is normal. No respiratory distress.     Breath sounds: No wheezing, rhonchi or rales.  Abdominal:     General: Bowel sounds are normal. There is no distension.     Palpations: Abdomen is soft.     Tenderness: There is no abdominal tenderness.  Musculoskeletal:     Right lower leg: No edema.     Left lower leg: No edema.  Skin:    General: Skin is warm and dry.     Findings: No rash.  Neurological:     Mental Status: She is alert.        Assessment & Plan:   Chronic pain syndrome I reviewed her [place]     Return in about 4 weeks (around 02/18/2023).   Crist Fat, MD

## 2023-01-21 NOTE — Assessment & Plan Note (Signed)
I reviewed her [place]

## 2023-01-23 ENCOUNTER — Telehealth: Payer: Self-pay

## 2023-01-23 NOTE — Telephone Encounter (Signed)
Pt called into office stating that oxycodone is wearing around the midpoint of the day. Pt asked if she could do a tyenol at mid day to help with pain management. Per Dr. Leonia Reader, this is fine just to make sure it is not done all the time. Called and LVM for pt.

## 2023-01-30 ENCOUNTER — Telehealth: Payer: Self-pay | Admitting: Gastroenterology

## 2023-01-30 DIAGNOSIS — M79675 Pain in left toe(s): Secondary | ICD-10-CM | POA: Diagnosis not present

## 2023-01-30 DIAGNOSIS — B351 Tinea unguium: Secondary | ICD-10-CM | POA: Diagnosis not present

## 2023-01-30 DIAGNOSIS — L84 Corns and callosities: Secondary | ICD-10-CM | POA: Diagnosis not present

## 2023-01-30 NOTE — Telephone Encounter (Signed)
Pt stated that she is congested and has drainage and questions if she can proceed with upcoming procedure.  Pt was notified to call back on 02/06/2023 with a symptom update.  Pt verbalized understanding with all questions answered.

## 2023-01-30 NOTE — Telephone Encounter (Signed)
Patient called and stated she has had double pneumonia and is conjested. She is wondering is it still okay to have colonoscopy scheduled for 05/06. Please advise, thank you.

## 2023-01-31 ENCOUNTER — Other Ambulatory Visit: Payer: Self-pay | Admitting: Internal Medicine

## 2023-02-06 ENCOUNTER — Ambulatory Visit: Payer: 59 | Admitting: Internal Medicine

## 2023-02-06 NOTE — Telephone Encounter (Signed)
Patient called to provide you with an update states she is better but still has drainage from her head to her throat. Please advise.

## 2023-02-06 NOTE — Telephone Encounter (Signed)
Pt stated that she is feeling better although she is still having some draining from Allergies. Pt was notified to proceed with colonoscopy that is scheduled on 02/11/2023 Pt verbalized understanding with all questions answered.

## 2023-02-07 ENCOUNTER — Other Ambulatory Visit: Payer: Self-pay | Admitting: Internal Medicine

## 2023-02-11 ENCOUNTER — Ambulatory Visit (AMBULATORY_SURGERY_CENTER): Payer: 59 | Admitting: Gastroenterology

## 2023-02-11 ENCOUNTER — Encounter: Payer: Self-pay | Admitting: Gastroenterology

## 2023-02-11 VITALS — BP 118/69 | HR 63 | Temp 97.1°F | Resp 13 | Ht 65.0 in | Wt 173.0 lb

## 2023-02-11 DIAGNOSIS — D122 Benign neoplasm of ascending colon: Secondary | ICD-10-CM

## 2023-02-11 DIAGNOSIS — Z1211 Encounter for screening for malignant neoplasm of colon: Secondary | ICD-10-CM

## 2023-02-11 DIAGNOSIS — R1031 Right lower quadrant pain: Secondary | ICD-10-CM

## 2023-02-11 MED ORDER — SODIUM CHLORIDE 0.9 % IV SOLN: 500.0000 mL | Freq: Once | INTRAVENOUS | Status: DC

## 2023-02-11 NOTE — Progress Notes (Signed)
VS completed by DT.  Pt's states no medical or surgical changes since previsit or office visit.  

## 2023-02-11 NOTE — Progress Notes (Signed)
Chief Complaint: Abdominal pain  Referring Provider:  Galvin Proffer, MD      ASSESSMENT AND PLAN;   #1. RLQ pain #2. Chronic constipation (OIC). H/O diarrhea #3. Am Nausea likely d/t narcotics. #4. + cologuard. Prev colon 08/2018 was limited d/t prep. #5. FH colon cancer (dad at age 75) #4. GERD on dexilant.  Plan: -CBC, CMP -CT AP with contrast -Miralax 17g po QD -Colon therafter with 2 day prep -Minimize narcotics.  CT AP 12/21/2022 1. No acute findings or explanation for the patient's symptoms. 2. No evidence of colonic malignancy or metastatic disease. Based on reported positive Cologuard study, further evaluation with colonoscopy or dedicated CT colonography should be considered. 3. Previously demonstrated enlarged ileocolonic mesenteric lymph nodes have decreased in size and are now within physiologic limits. 4. 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 No evidence of large vessel occlusion or ischemic bowel. 5. Postsurgical changes in the lower lumbar spine with chronic anterolisthesis and mild foraminal narrowing bilaterally at L4-5. 6.  Aortic Atherosclerosis (ICD10-I70.0).  Discussed risks & benefits of colonoscopy. Risks including rare perforation req laparotomy, bleeding after bx/polypectomy req blood transfusion, rarely missing neoplasms, risks of anesthesia/sedation, rare risk of damage to internal organs. Benefits outweigh the risks. Patient agrees to proceed. All the questions were answered. Pt consents to proceed. HPI:    Frances Newman is a 77 y.o. female  With DM2, COPD (not on O2), HTN, HLD, generalized OA, anxiety/depression/insomnia, anemia of chronic disease  Arthritis on oxy 20 BID for pain control  Has been constipated ever since.  Previous history of diarrhea. With right lower quadrant abdominal pain-moderate to severe Also with nausea but no vomiting.  Had positive  Cologuard  Advised colonoscopy.  She is a very poor historian. She has been having intermittent occasional rectal bleeding.  Heartburn is under good control on Dexilant.  No odynophagia or dysphagia.  No weight loss.  Has gained weight as below.   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  Biopsies were negative -Colonoscopy 08/2018- ([facility])Colonic polyp status post polypectomy. - Mild sigmoid diverticulosis - Non-bleeding external and internal hemorrhoids.  Fair prep. -Colonoscopy 10/20/2010 (adult) mild sigmoid diverticulosis, small internal hemorrhoids. -Upper GI with small bowel follow-through 01/29/2017 normal -Capsule endoscopy 06/2006 normal, incidental lymphangiectasia  Past Medical History:  Diagnosis Date   Arthritis    Asthma    DDD (degenerative disc disease), thoracic    Diabetes mellitus without complication (HCC)    diet controlled/ meds were stopped   Gall bladder disease    GERD (gastroesophageal reflux disease)    Hypertension    T2DM (type 2 diabetes mellitus) (HCC)     Past Surgical History:  Procedure Laterality Date   ANTERIOR CRUCIATE LIGAMENT REPAIR  2004   right replaced   APPENDECTOMY  1972   BACK SURGERY     2 back surgeries   CHOLECYSTECTOMY  2015   COLONOSCOPY  10/20/2010   Mild sigmoid diverticulosis. Small internal hemorrhoids.    ESOPHAGOGASTRODUODENOSCOPY  02/21/2017   Presbyesophagus status esophageal dilataion. Small antral polyps polypectomy.    FOOT SURGERY     FOOT SURGERY     Bil   HAND SURGERY  Had CTR on both hands/ 2 surgeries on right hand   kidney stones  2006   KNEE SURGERY     OVARY SURGERY     SHOULDER SURGERY     Bil shoulder surgery   TONSILLECTOMY  1966   TOTAL ABDOMINAL HYSTERECTOMY       Family History  Problem Relation Age of Onset   Other Mother    Colon cancer Father    Gout Sister    Peripheral vascular disease Brother    Prostate cancer Brother    Peripheral vascular disease Sister     Social History   Tobacco Use   Smoking status: Former    Types: Cigarettes   Smokeless tobacco: Never   Tobacco comments:    quit 35 years  Vaping Use   Vaping Use: Never used  Substance Use Topics   Alcohol use: No    Alcohol/week: 0.0 standard drinks of alcohol   Drug use: No    Current Outpatient Medications  Medication Sig Dispense Refill   albuterol (VENTOLIN HFA) 108 (90 Base) MCG/ACT inhaler Inhale 2 puffs into the lungs every 4 (four) hours as needed for wheezing or shortness of breath.     ALPRAZolam (XANAX) 0.25 MG tablet Take 1 tablet (0.25 mg total) by mouth daily as needed for anxiety. 20 tablet 2   aspirin 81 MG tablet Take by mouth daily.     Blood Glucose Monitoring Suppl (ACCU-CHEK GUIDE) w/Device KIT by Does not apply route.     dexlansoprazole (DEXILANT) 60 MG capsule Take 1 capsule (60 mg total) by mouth daily. 30 capsule 4   donepezil (ARICEPT) 5 MG tablet Take 5 mg by mouth at bedtime.     escitalopram (LEXAPRO) 10 MG tablet TAKE 1 TABLET BY MOUTH DAILY 30 tablet 3   fluticasone (FLONASE) 50 MCG/ACT nasal spray Place 1 spray into both nostrils daily.     hydrOXYzine (ATARAX) 10 MG tablet Take 10 mg by mouth 2 (two) times daily as needed for anxiety.     loratadine (CLARITIN) 10 MG tablet Take 10 mg by mouth daily.     metoprolol tartrate (LOPRESSOR) 25 MG tablet Take 12.5 mg by mouth 2 (two) times daily.      montelukast (SINGULAIR) 10 MG tablet Take 10 mg by mouth daily.     ondansetron (ZOFRAN) 4 MG tablet Take 1 tablet (4 mg total) by mouth every 8 (eight) hours as needed for nausea or vomiting. 20 tablet 0   oxyCODONE ER (XTAMPZA ER) 9 MG C12A Take 1 capsule by mouth in the morning and at bedtime. 60 capsule 0   potassium citrate (UROCIT-K)  10 MEQ (1080 MG) SR tablet Take 10 mEq by mouth daily.     revefenacin (YUPELRI) 175 MCG/3ML nebulizer solution Take 175 mcg by nebulization 3 (three) times daily as needed.     rosuvastatin (CRESTOR) 10 MG tablet TAKE 1 TABLET ONCE A DAY 90 tablet 0   Vitamin D, Ergocalciferol, (DRISDOL) 1.25 MG (50000 UNIT) CAPS capsule TAKE 1 CAPSULE ONCE A WEEK 12 capsule 0   diclofenac Sodium (VOLTAREN) 1 % GEL Apply 4 g topically 2 (two) times daily.     doxycycline (MONODOX) 100 MG capsule Take 100 mg by mouth 2 (two) times daily.     ferrous sulfate 325 (65 FE) MG EC tablet Take 325 mg by mouth daily.     nitroGLYCERIN (NITROSTAT) 0.4 MG SL tablet Place 0.4 mg under the tongue every 5 (five) minutes as  needed for chest pain. If no relief after 3 doses call 911     Current Facility-Administered Medications  Medication Dose Route Frequency Provider Last Rate Last Admin   0.9 %  sodium chloride infusion  500 mL Intravenous Once Lynann Bologna, MD        Allergies  Allergen Reactions   Levaquin [Levofloxacin In D5w]     Throat closes up.   Zithromax [Azithromycin]     Causes a rash   Prednisone Other (See Comments)    Hypotension    Review of Systems:  neg     Physical Exam:    BP (!) 161/86   Pulse 75   Temp (!) 97.1 F (36.2 C) (Temporal)   Ht 5\' 5"  (1.651 m)   Wt 173 lb (78.5 kg)   LMP  (LMP Unknown)   SpO2 99%   BMI 28.79 kg/m  Filed Weights   02/11/23 0842  Weight: 173 lb (78.5 kg)   Constitutional:  Well-developed, in no acute distress. Psychiatric: Normal mood and affect. Behavior is normal. Cardiovascular: Normal rate, regular rhythm. No edema Pulmonary/chest: Effort normal and breath sounds normal. No wheezing, rales or rhonchi. Abdominal: Soft, nondistended.  Mild lower abdominal tenderness. Bowel sounds active throughout. There are no masses palpable. No hepatomegaly. Neurological: Alert and oriented to person place and time. Skin: Skin is warm and dry. No rashes  noted.  Data Reviewed: I have personally reviewed following labs and imaging studies    Latest Ref Rng & Units 12/14/2022    2:45 PM 10/30/2018   10:10 AM 05/19/2010    9:04 AM  CBC  WBC 4.0 - 10.5 K/uL 13.7  8.3  10.0   Hemoglobin 12.0 - 15.0 g/dL 41.3  24.4  01.0   Hematocrit 36.0 - 46.0 % 38.4  34.3  36.7   Platelets 150.0 - 400.0 K/uL 223.0  353.0  239       Latest Ref Rng & Units 12/14/2022    2:45 PM 10/30/2018   10:10 AM 05/19/2010    9:04 AM  CMP  Glucose 70 - 99 mg/dL 89  272  536   BUN 6 - 23 mg/dL 19  20  21    Creatinine 0.40 - 1.20 mg/dL 6.44  0.34  7.42   Sodium 135 - 145 mEq/L 141  138  144   Potassium 3.5 - 5.1 mEq/L 4.3  4.5  5.3   Chloride 96 - 112 mEq/L 103  111  110   CO2 19 - 32 mEq/L 29  18  28    Calcium 8.4 - 10.5 mg/dL 59.5  8.8  9.6   Total Protein 6.0 - 8.3 g/dL 7.3  5.6    Total Bilirubin 0.2 - 1.2 mg/dL 0.3  0.3    Alkaline Phos 39 - 117 U/L 69  61    AST 0 - 37 U/L 23  11    ALT 0 - 35 U/L 20  7       Edman Circle, MD 02/11/2023, 9:27 AM  Cc: Galvin Proffer, MD

## 2023-02-11 NOTE — Progress Notes (Signed)
To pacu, VSS. Report to [email or web address] 

## 2023-02-11 NOTE — Progress Notes (Signed)
Called to room to assist during endoscopic procedure.  Patient ID and intended procedure confirmed with present staff. Received instructions for my participation in the procedure from the performing physician.  

## 2023-02-11 NOTE — Op Note (Signed)
[facility] Patient Name: Frances Newman Procedure Date: 02/11/2023 9:24 AM MRN: [id number] Endoscopist: Lynann Bologna , MD, [id number] Age: 77 Referring MD:  Date of Birth: 1945/10/11 Gender: Female Account #: [id number] Procedure:                Colonoscopy Indications:              Positive Cologuard test Medicines:                Monitored Anesthesia Care Procedure:                Pre-Anesthesia Assessment:                           - Prior to the procedure, a History and Physical                            was performed, and patient medications and                            allergies were reviewed. The patient's tolerance of                            previous anesthesia was also reviewed. The risks                            and benefits of the procedure and the sedation                            options and risks were discussed with the patient.                            All questions were answered, and informed consent                            was obtained. Prior Anticoagulants: The patient has                            taken no anticoagulant or antiplatelet agents. ASA                            Grade Assessment: III - A patient with severe                            systemic disease. After reviewing the risks and                            benefits, the patient was deemed in satisfactory                            condition to undergo the procedure.                           After obtaining informed consent, the colonoscope  was passed under direct vision. Throughout the                            procedure, the patient's blood pressure, pulse, and                            oxygen saturations were monitored continuously. The                            [id number]-HQ190L Colonoscope [id number] was introduced                            through the anus and advanced to the 2 cm into the                            ileum. The colonoscopy was  performed without                            difficulty. The patient tolerated the procedure                            well. The quality of the bowel preparation was                            adequate to identify polyps greater than 5 mm in                            size. The terminal ileum, ileocecal valve,                            appendiceal orifice, and rectum were photographed. Scope In: 9:32:36 AM Scope Out: 9:43:30 AM Scope Withdrawal Time: 0 hours 7 minutes 50 seconds  Total Procedure Duration: 0 hours 10 minutes 54 seconds  Findings:                 An 8 mm polyp was found in the distal ascending                            colon. The polyp was sessile. The polyp was removed                            with a cold snare. Resection and retrieval were                            complete.                           Multiple medium-mouthed diverticula were found in                            the sigmoid colon and ascending colon.                           Non-bleeding internal hemorrhoids were found during  retroflexion. The hemorrhoids were small and Grade                            I (internal hemorrhoids that do not prolapse).                           The terminal ileum appeared normal.                           The exam was otherwise without abnormality on                            direct and retroflexion views. Complications:            No immediate complications. Estimated Blood Loss:     Estimated blood loss: none. Impression:               - One 8 mm polyp in the distal ascending colon,                            removed with a cold snare. Resected and retrieved.                           - Diverticulosis in the sigmoid colon and in the                            ascending colon.                           - Non-bleeding internal hemorrhoids.                           - The examined portion of the ileum was normal.                           -  The examination was otherwise normal on direct                            and retroflexion views. Recommendation:           - Patient has a contact number available for                            emergencies. The signs and symptoms of potential                            delayed complications were discussed with the                            patient. Return to normal activities tomorrow.                            Written discharge instructions were provided to the                            patient.                           -  Resume previous diet.                           - Continue present medications.                           - Await pathology results.                           - Repeat colonoscopy is not recommended for                            surveillance.                           - The findings and recommendations were discussed                            with the patient's family. Lynann Bologna, MD 02/11/2023 9:51:29 AM This report has been signed electronically.

## 2023-02-11 NOTE — Patient Instructions (Signed)
   Miralax one capful daily ( this is over the counter)   Handouts on polyps,diverticulosis,& hemorrhoids given to you today.   Await pathology results on polyps removed    YOU HAD AN ENDOSCOPIC PROCEDURE TODAY AT THE [facility]:   Refer to the procedure report that was given to you for any specific questions about what was found during the examination.  If the procedure report does not answer your questions, please call your gastroenterologist to clarify.  If you requested that your care partner not be given the details of your procedure findings, then the procedure report has been included in a sealed envelope for you to review at your convenience later.  YOU SHOULD EXPECT: Some feelings of bloating in the abdomen. Passage of more gas than usual.  Walking can help get rid of the air that was put into your GI tract during the procedure and reduce the bloating. If you had a lower endoscopy (such as a colonoscopy or flexible sigmoidoscopy) you may notice spotting of blood in your stool or on the toilet paper. If you underwent a bowel prep for your procedure, you may not have a normal bowel movement for a few days.  Please Note:  You might notice some irritation and congestion in your nose or some drainage.  This is from the oxygen used during your procedure.  There is no need for concern and it should clear up in a day or so.  SYMPTOMS TO REPORT IMMEDIATELY:  Following lower endoscopy (colonoscopy or flexible sigmoidoscopy):  Excessive amounts of blood in the stool  Significant tenderness or worsening of abdominal pains  Swelling of the abdomen that is new, acute  Fever of 100F or higher  For urgent or emergent issues, a gastroenterologist can be reached at any hour by calling [phone]. Do not use MyChart messaging for urgent concerns.    DIET:  We do recommend a small meal at first, but then you may proceed to your regular diet.  Drink plenty of fluids but you should  avoid alcoholic beverages for 24 hours.  ACTIVITY:  You should plan to take it easy for the rest of today and you should NOT DRIVE or use heavy machinery until tomorrow (because of the sedation medicines used during the test).    FOLLOW UP: Our staff will call the number listed on your records the next business day following your procedure.  We will call around 7:15- 8:00 am to check on you and address any questions or concerns that you may have regarding the information given to you following your procedure. If we do not reach you, we will leave a message.     If any biopsies were taken you will be contacted by phone or by letter within the next 1-3 weeks.  Please call us at [phone] if you have not heard about the biopsies in 3 weeks.    SIGNATURES/CONFIDENTIALITY: You and/or your care partner have signed paperwork which will be entered into your electronic medical record.  These signatures attest to the fact that that the information above on your After Visit Summary has been reviewed and is understood.  Full responsibility of the confidentiality of this discharge information lies with you and/or your care-partner.

## 2023-02-12 ENCOUNTER — Telehealth: Payer: Self-pay | Admitting: *Deleted

## 2023-02-12 NOTE — Telephone Encounter (Signed)
  Follow up Call-     02/11/2023    8:44 AM  Call back number  Post procedure Call Back phone  # [phone]  Permission to leave phone message Yes     Patient questions:   Message left to call us if necessary.

## 2023-02-13 ENCOUNTER — Encounter: Payer: Self-pay | Admitting: Gastroenterology

## 2023-02-18 DIAGNOSIS — M62572 Muscle wasting and atrophy, not elsewhere classified, left ankle and foot: Secondary | ICD-10-CM | POA: Diagnosis not present

## 2023-02-18 DIAGNOSIS — M62571 Muscle wasting and atrophy, not elsewhere classified, right ankle and foot: Secondary | ICD-10-CM | POA: Diagnosis not present

## 2023-02-19 ENCOUNTER — Encounter: Payer: Self-pay | Admitting: Internal Medicine

## 2023-02-19 ENCOUNTER — Ambulatory Visit: Payer: 59 | Admitting: Internal Medicine

## 2023-02-19 VITALS — BP 150/72 | HR 71 | Temp 97.6°F | Resp 16 | Ht 65.0 in | Wt 174.6 lb

## 2023-02-19 DIAGNOSIS — Z5181 Encounter for therapeutic drug level monitoring: Secondary | ICD-10-CM | POA: Diagnosis not present

## 2023-02-19 DIAGNOSIS — G894 Chronic pain syndrome: Secondary | ICD-10-CM

## 2023-02-19 DIAGNOSIS — Z79899 Other long term (current) drug therapy: Secondary | ICD-10-CM | POA: Diagnosis not present

## 2023-02-19 DIAGNOSIS — F411 Generalized anxiety disorder: Secondary | ICD-10-CM

## 2023-02-19 HISTORY — DX: Generalized anxiety disorder: F41.1

## 2023-02-19 MED ORDER — XTAMPZA ER 9 MG PO C12A: 1.0000 | EXTENDED_RELEASE_CAPSULE | Freq: Two times a day (BID) | ORAL | 0 refills | Status: DC

## 2023-02-19 MED ORDER — METHYLPREDNISOLONE 4 MG PO TBPK: ORAL_TABLET | ORAL | 0 refills | Status: DC

## 2023-02-19 MED ORDER — GABAPENTIN 300 MG PO CAPS: 300.0000 mg | ORAL_CAPSULE | Freq: Every day | ORAL | 2 refills | Status: DC

## 2023-02-19 MED ORDER — ESCITALOPRAM OXALATE 20 MG PO TABS: 20.0000 mg | ORAL_TABLET | Freq: Every day | ORAL | 3 refills | Status: DC

## 2023-02-19 NOTE — Assessment & Plan Note (Signed)
I do not think she has depression but she does continue with anxiety.  She denies panic attacks.  We will increase her dose of lexapro and I am going to see if we can in another 2 months start coming off her dose of xanax.

## 2023-02-19 NOTE — Progress Notes (Signed)
Office Visit  Subjective   Patient ID: Frances Newman   DOB: 1946-02-12   Age: 77 y.o.   MRN: [id number]   Chief Complaint Chief Complaint  Patient presents with   Follow-up    Pain     History of Present Illness Frances Newman is a 77 yo female who comes in today for followup of her chronic pain syndrome.  She was seen a month ago by myself where she came to establish care.  She has had problems with chronic pain for over the last 6-7 years.  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  She is now having right hip pain as well.  The patient states she was started on pain meds over 2 years ago and they have been going up on the dose to control her pain.  She was on oxycodone 20mg  TID but she saw her previous PCP Dr. Ardelle Park 2 months ago and told him she wants to cut back on her meds and he decreased her down to oxycodone 20mg  BID.  I saw her a month ago and switched her pain meds to Xtampza 9mg  po BID.  She states this does help the pain but she has to use tylenol to supplement and she states this makes her pain tolerable.   I have reviewed her MRI of her lumbar spine from 12/21/2015 and this showed new severe L2-L3 severe spinal stenosis and severe right and moderate left neural foraminal stenosis with postoperative changes at L3-L4 and L4-L5.  There is mild right and moderate left neural foraminal stenosis at L5-S1.  She states she did have an ESI done a few years ago in [place].  Her worst pain is her back pain that is an intermittent sharp pain and depends on what she is doing.  She states that her pain in her lower back can be a 7 out of 10 at times on the pain scale.  She states she has undergone physical therapy for her pain.  She denies any new weakness/numbness and no  loss of bowel/bladder function.  Her last dose of Xtampza was this morning.  She also states her anxiety is not controlled.  She also wonders if she has depression.  She states she was diagnosed with anxiety about 2 years ago after her husband died.  She is currently on lexapro 10mg  daily and uses xanax 0.25mg  po daily prn which she uses once a day.  She is also on hydroxyzine 10mg  twice a day as needed which she normally takes at night.  She denies any panic attacks.  The symptoms have been present for years and her anxiety is moderate in severity. She admits to problems just with feeling anxious and some insomnia.  She denies any difficulty concentrating, difficulty performing routine daily activities, fatigue, extreme feelings of guilt, feelings of isolation, feelings of worthlessness, helpless feeling, hypersomnia, iloss of appetite, social withdrawal, suicidal ideation, homicidal ideation, loss of interest in pleasurable activities, out of control feelings, and weight loss.,This patient feels that she is able to care for herself. She has no predisposing factors for depression or anxiety. She currently lives with her daughter who is mentally handicapped. She has no significant prior history of mental health disorders.      Past Medical  History Past Medical History:  Diagnosis Date   Arthritis    Asthma    DDD (degenerative disc disease), thoracic    Diabetes mellitus without complication (HCC)    diet controlled/ meds were stopped   Gall bladder disease    GERD (gastroesophageal reflux disease)    Hypertension    T2DM (type 2 diabetes mellitus) (HCC)      Allergies Allergies  Allergen Reactions   Levaquin [Levofloxacin In D5w]     Throat closes up.   Zithromax [Azithromycin]     Causes a rash   Prednisone Other (See Comments)    Hypotension     Medications  Current Outpatient Medications:    gabapentin (NEURONTIN) 300 MG capsule, Take 1 capsule (300 mg total) by mouth at  bedtime., Disp: 30 capsule, Rfl: 2   methylPREDNISolone (MEDROL DOSEPAK) 4 MG TBPK tablet, Take as directed., Disp: 21 tablet, Rfl: 0   oxyCODONE ER (XTAMPZA ER) 9 MG C12A, Take 1 capsule by mouth in the morning and at bedtime., Disp: 60 capsule, Rfl: 0   [START ON 03/21/2023] oxyCODONE ER (XTAMPZA ER) 9 MG C12A, Take 1 capsule by mouth in the morning and at bedtime., Disp: 60 capsule, Rfl: 0   albuterol (VENTOLIN HFA) 108 (90 Base) MCG/ACT inhaler, Inhale 2 puffs into the lungs every 4 (four) hours as needed for wheezing or shortness of breath., Disp: , Rfl:    ALPRAZolam (XANAX) 0.25 MG tablet, Take 1 tablet (0.25 mg total) by mouth daily as needed for anxiety., Disp: 20 tablet, Rfl: 2   aspirin 81 MG tablet, Take by mouth daily., Disp: , Rfl:    Blood Glucose Monitoring Suppl (ACCU-CHEK GUIDE) w/Device KIT, by Does not apply route., Disp: , Rfl:    dexlansoprazole (DEXILANT) 60 MG capsule, Take 1 capsule (60 mg total) by mouth daily., Disp: 30 capsule, Rfl: 4   diclofenac Sodium (VOLTAREN) 1 % GEL, Apply 4 g topically 2 (two) times daily., Disp: , Rfl:    donepezil (ARICEPT) 5 MG tablet, Take 5 mg by mouth at bedtime., Disp: , Rfl:    doxycycline (MONODOX) 100 MG capsule, Take 100 mg by mouth 2 (two) times daily., Disp: , Rfl:    ferrous sulfate 325 (65 FE) MG EC tablet, Take 325 mg by mouth daily., Disp: , Rfl:    fluticasone (FLONASE) 50 MCG/ACT nasal spray, Place 1 spray into both nostrils daily., Disp: , Rfl:    hydrOXYzine (ATARAX) 10 MG tablet, Take 10 mg by mouth 2 (two) times daily as needed for anxiety., Disp: , Rfl:    loratadine (CLARITIN) 10 MG tablet, Take 10 mg by mouth daily., Disp: , Rfl:    metoprolol tartrate (LOPRESSOR) 25 MG tablet, Take 12.5 mg by mouth 2 (two) times daily. , Disp: , Rfl:    montelukast (SINGULAIR) 10 MG tablet, Take 10 mg by mouth daily., Disp: , Rfl:    nitroGLYCERIN (NITROSTAT) 0.4 MG SL tablet, Place 0.4 mg under the tongue every 5 (five) minutes as  needed for chest pain. If no relief after 3 doses call 911, Disp: , Rfl:    ondansetron (ZOFRAN) 4 MG tablet, Take 1 tablet (4 mg total) by mouth every 8 (eight) hours as needed for nausea or vomiting., Disp: 20 tablet, Rfl: 0   potassium citrate (UROCIT-K) 10 MEQ (1080 MG) SR tablet, Take 10 mEq by mouth daily., Disp: , Rfl:    revefenacin (YUPELRI) 175 MCG/3ML nebulizer solution, Take 175 mcg by nebulization 3 (three)  times daily as needed., Disp: , Rfl:    rosuvastatin (CRESTOR) 10 MG tablet, TAKE 1 TABLET ONCE A DAY, Disp: 90 tablet, Rfl: 0   Vitamin D, Ergocalciferol, (DRISDOL) 1.25 MG (50000 UNIT) CAPS capsule, TAKE 1 CAPSULE ONCE A WEEK, Disp: 12 capsule, Rfl: 0   Review of Systems Review of Systems  Constitutional:  Negative for chills and fever.  Eyes:  Negative for blurred vision and double vision.  Respiratory:  Negative for shortness of breath.   Cardiovascular:  Negative for chest pain, palpitations and leg swelling.  Gastrointestinal:  Positive for nausea. Negative for abdominal pain, constipation, diarrhea and vomiting.  Musculoskeletal:  Positive for back pain and joint pain.  Neurological:  Negative for dizziness, weakness and headaches.       Objective:    Vitals BP (!) 150/72   Pulse 71   Temp 97.6 F (36.4 C)   Resp 16   Ht 5\' 5"  (1.651 m)   Wt 174 lb 9.6 oz (79.2 kg)   LMP  (LMP Unknown)   SpO2 95%   BMI 29.05 kg/m    Physical Examination Physical Exam Constitutional:      Appearance: Normal appearance. She is not ill-appearing.  Cardiovascular:     Rate and Rhythm: Normal rate and regular rhythm.     Pulses: Normal pulses.     Heart sounds: No murmur heard.    No friction rub. No gallop.  Pulmonary:     Effort: Pulmonary effort is normal. No respiratory distress.     Breath sounds: No wheezing, rhonchi or rales.  Abdominal:     General: Bowel sounds are normal. There is no distension.     Palpations: Abdomen is soft.     Tenderness: There is no  abdominal tenderness.  Musculoskeletal:     Right lower leg: No edema.     Left lower leg: No edema.  Skin:    General: Skin is warm and dry.     Findings: No rash.  Neurological:     General: No focal deficit present.     Mental Status: She is alert and oriented to person, place, and time.        Assessment & Plan:   Chronic pain syndrome On her last visit, we switched her oxycodone 20mg  tabs to Xtampza 9mg  po BID.  She states she wants to come off pain meds and go on a "regular pain med".  I asked her what that meant and she states she wanted her pain controlled with something besides an opiate.  I am going to get her setup to be evaluated by ortho spine to see if they can give her another ESI since her back is her worse pain.  I will also add gabapentin at night.  If she does get an ESI, we will see if we can start weaning off pain meds.  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  I will give her a medrol dose pak.  We will check a UDS on her today.  We will sign her up for a pain contract and I reviewed her [place] controlled substance registry.  GAD (generalized anxiety disorder) I do not think she has depression but she does continue with anxiety.  She denies panic attacks.  We will increase her dose of lexapro and I am going to see if we can in another 2 months start coming off her dose  of xanax.      Return in about 8 weeks (around 04/16/2023).   Crist Fat, MD

## 2023-02-19 NOTE — Assessment & Plan Note (Signed)
On her last visit, we switched her oxycodone 20mg  tabs to Xtampza 9mg  po BID.  She states she wants to come off pain meds and go on a "regular pain med".  I asked her what that meant and she states she wanted her pain controlled with something besides an opiate.  I am going to get her setup to be evaluated by ortho spine to see if they can give her another ESI since her back is her worse pain.  I will also add gabapentin at night.  If she does get an ESI, we will see if we can start weaning off pain meds.  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  I will give her a medrol dose pak.  We will check a UDS on her today.  We will sign her up for a pain contract and I reviewed her [place] controlled substance registry.

## 2023-02-23 ENCOUNTER — Other Ambulatory Visit: Payer: Self-pay | Admitting: Internal Medicine

## 2023-02-27 ENCOUNTER — Other Ambulatory Visit: Payer: Self-pay | Admitting: Internal Medicine

## 2023-03-01 ENCOUNTER — Encounter: Payer: Self-pay | Admitting: Internal Medicine

## 2023-03-01 ENCOUNTER — Ambulatory Visit (INDEPENDENT_AMBULATORY_CARE_PROVIDER_SITE_OTHER): Payer: 59 | Admitting: Internal Medicine

## 2023-03-01 VITALS — BP 156/88 | HR 60 | Resp 16 | Ht 65.0 in | Wt 178.2 lb

## 2023-03-01 DIAGNOSIS — H1045 Other chronic allergic conjunctivitis: Secondary | ICD-10-CM

## 2023-03-01 DIAGNOSIS — J3089 Other allergic rhinitis: Secondary | ICD-10-CM

## 2023-03-01 DIAGNOSIS — J4530 Mild persistent asthma, uncomplicated: Secondary | ICD-10-CM

## 2023-03-01 DIAGNOSIS — K219 Gastro-esophageal reflux disease without esophagitis: Secondary | ICD-10-CM | POA: Diagnosis not present

## 2023-03-01 DIAGNOSIS — Z87891 Personal history of nicotine dependence: Secondary | ICD-10-CM

## 2023-03-01 MED ORDER — AZELASTINE HCL 0.1 % NA SOLN: 1.0000 | Freq: Two times a day (BID) | NASAL | 12 refills | Status: DC

## 2023-03-01 MED ORDER — OLOPATADINE HCL 0.2 % OP SOLN: 1.0000 [drp] | OPHTHALMIC | 5 refills | Status: DC

## 2023-03-01 MED ORDER — FLUTICASONE-SALMETEROL 45-21 MCG/ACT IN AERO: 2.0000 | INHALATION_SPRAY | Freq: Two times a day (BID) | RESPIRATORY_TRACT | 12 refills | Status: DC

## 2023-03-01 MED ORDER — FLUTICASONE FUROATE-VILANTEROL 200-25 MCG/ACT IN AEPB: 1.0000 | INHALATION_SPRAY | Freq: Every day | RESPIRATORY_TRACT | 5 refills | Status: DC

## 2023-03-01 NOTE — Addendum Note (Signed)
Addended by: Ralph Leyden on: 03/01/2023 11:15 AM   Modules accepted: Orders

## 2023-03-01 NOTE — Progress Notes (Signed)
New Patient Note  RE: Frances Newman MRN: [id number] DOB: 04-04-1946 Date of Office Visit: 03/01/2023  Consult requested by: Crist Fat, MD Primary care provider: Crist Fat, MD  Chief Complaint: Allergies  History of Present Illness: I had the pleasure of seeing Frances Newman for initial evaluation at the [facility] on 03/01/2023. She is a 77 y.o. female, who is referred here by Crist Fat, MD for the evaluation of rhinitis.  History obtained from patient, chart review.  Chronic rhinitis: started the past few years Symptoms include:  sinus pressure, headaches, nasal congestion, rhinorrhea, post nasal drainage, sneezing, watery eyes, and itchy eyes  Sinus infections 3-4 times a year treats with steroids and ABX.  Occurs  winter Potential triggers: denies animal triggers  Treatments tried: claritin, flonase,  Previous allergy testing: no History of reflux/heartburn: yes: on dexilant uncontrolled  History of chronic sinusitis or sinus surgery: no Nonallergic triggers:  denies      She had asthma as a child which was persistent, but she grew out of it mostly.  Now will have cough, chest "congestion" with URI or exercise      Assessment and Plan: Ladesha is a 77 y.o. female with: Other allergic rhinitis - Plan: Allergens w/Total IgE Area 2  Not well controlled mild persistent asthma - Plan: Spirometry with Graph  Other chronic allergic conjunctivitis of both eyes  Gastroesophageal reflux disease without esophagitis  Stopped smoking with greater than 20 pack year history   Plan: Patient Instructions  Chronic Rhinitis  perennial : - allergy testing today: deferred due to [facility] insurance, will get blood work first and go from there   - Prevention:  - allergen avoidance when possible - consider allergy shots as long term control of your symptoms by teaching your immune system to be more tolerant of your allergy triggers  - Symptom  control: - Start Nasal Steroid Spray: Best results if used daily. - Options include Flonase (fluticasone), Nasocort (triamcinolone), Nasonex (mometasome) 1- 2 sprays in each nostril daily.  - All can be purchased over-the-counter if not covered by insurance. - Start Astelin (azelastine) 1-2 sprays in each nostril twice a day as needed for nasal congestion/itchy nose - Continue Singulair (Montelukast) 10mg  nightly.   - Discontinue if nightmares of behavior changes. - Continue Antihistamine: daily or daily as needed.   -Options include Zyrtec (Cetirizine) 10mg , Claritin (Loratadine) 10mg , Allegra (Fexofenadine) 180mg , or Xyzal (Levocetirinze) 5mg  - Can be purchased over-the-counter if not covered by insurance.  Allergic Conjunctivitis:  - Consider Allergy Eye drops-great options include Pataday (Olopatadine) or Zaditor (ketotifen) for eye symptoms daily as needed-both sold over the counter if not covered by insurance. and Rewetting Drops such as Systane,TheraTears, etc  -Avoid eye drops that say red eye relief as they may contain medications that dry out your eyes.   Mild Persistent Asthma: not well controlled  Breathing test today showed;  some inflammation in your lungs  - Start Breo 1 puff daily THIS SHOULD BE USED EVERY DAY - Rinse mouth out after use - Exhale fully before each puff - Use Albuterol (Proair/Ventolin) 2 puffs every 4-6 hours as needed for chest tightness, wheezing, or coughing - Use Albuterol (Proair/Ventolin) 2 puffs 15 minutes prior to exercise if you have symptoms with activity - Use a spacer with all inhalers - please keep track of how often you are needing rescue inhaler Albuterol (Proair/Ventolin) as this will help guide future management - Asthma is  not controlled if:  - Symptoms are occurring >2 times a week  during the day  OR  - >2 times a month nighttime awakenings  - Please call the clinic to schedule a follow up if these symptoms arise  GERD  - Continue  dexilant as prescribed - Continue diet and lifestyle modifications for treatment   Follow up: we will call you with lab results and plan   Thank you so much for letting me partake in your care today.  Don't hesitate to reach out if you have any additional concerns!  Ferol Luz, MD  [facility] [place] [place]    Meds ordered this encounter  Medications   azelastine (ASTELIN) 0.1 % nasal spray    Sig: Place 1 spray into both nostrils 2 (two) times daily. Use in each nostril as directed    Dispense:  30 mL    Refill:  12   Olopatadine HCl (PATADAY) 0.2 % SOLN    Sig: Place 1 drop into both eyes 1 day or 1 dose.    Dispense:  2.5 mL    Refill:  5   fluticasone-salmeterol (ADVAIR HFA) 45-21 MCG/ACT inhaler    Sig: Inhale 2 puffs into the lungs 2 (two) times daily.    Dispense:  1 each    Refill:  12   Lab Orders         Allergens w/Total IgE Area 2      Other allergy screening: Asthma: yes Rhino conjunctivitis: yes Food allergy: no Medication allergy: no Hymenoptera allergy: no Urticaria: no Eczema:no History of recurrent infections suggestive of immunodeficency: no  Diagnostics: Spirometry:  Tracings reviewed. Her effort: It was hard to get consistent efforts and there is a question as to whether this reflects a maximal maneuver. FVC: 2.49L FEV1: 1.58L, 85% predicted FEV1/FVC ratio: 63% Interpretation: Spirometry consistent with mild obstructive disease.  Please see scanned spirometry results for details.  Skin Testing:  Deferred due to [facility] insurance  .    Past Medical History: Patient Active Problem List   Diagnosis Date Noted   GAD (generalized anxiety disorder) 02/19/2023   Seasonal allergies 01/21/2023   Chronic pain syndrome 01/21/2023   Past Medical History:  Diagnosis Date   Arthritis    Asthma    DDD (degenerative disc disease), thoracic    Diabetes mellitus without complication (HCC)    diet controlled/ meds were stopped    Gall bladder disease    GERD (gastroesophageal reflux disease)    Hypertension    T2DM (type 2 diabetes mellitus) (HCC)    Past Surgical History: Past Surgical History:  Procedure Laterality Date   ANTERIOR CRUCIATE LIGAMENT REPAIR  2004   right replaced   APPENDECTOMY  1972   BACK SURGERY     2 back surgeries   CHOLECYSTECTOMY  2015   COLONOSCOPY  10/20/2010   Mild sigmoid diverticulosis. Small internal hemorrhoids.    ESOPHAGOGASTRODUODENOSCOPY  02/21/2017   Presbyesophagus status esophageal dilataion. Small antral polyps polypectomy.    FOOT SURGERY     FOOT SURGERY     Bil   HAND SURGERY     Had CTR on both hands/ 2 surgeries on right hand   kidney stones  2006   KNEE SURGERY     OVARY SURGERY     SHOULDER SURGERY     Bil shoulder surgery   TONSILLECTOMY  1966   TOTAL ABDOMINAL HYSTERECTOMY     Medication List:  Current Outpatient Medications  Medication Sig Dispense Refill   albuterol (VENTOLIN HFA) 108 (90 Base) MCG/ACT inhaler Inhale 2 puffs into the lungs every 4 (four) hours as needed for wheezing or shortness of breath.     ALPRAZolam (XANAX) 0.25 MG tablet Take 1 tablet (0.25 mg total) by mouth daily as needed for anxiety. 20 tablet 2   aspirin 81 MG tablet Take by mouth daily.     azelastine (ASTELIN) 0.1 % nasal spray Place 1 spray into both nostrils 2 (two) times daily. Use in each nostril as directed 30 mL 12   Blood Glucose Monitoring Suppl (ACCU-CHEK GUIDE) w/Device KIT by Does not apply route.     dexlansoprazole (DEXILANT) 60 MG capsule Take 1 capsule (60 mg total) by mouth daily. 30 capsule 4   diclofenac Sodium (VOLTAREN) 1 % GEL Apply 4 g topically 2 (two) times daily.     donepezil (ARICEPT) 5 MG tablet Take 5 mg by mouth at bedtime.     escitalopram (LEXAPRO) 20 MG tablet Take 1 tablet (20 mg total) by mouth daily. 90 tablet 3   ferrous sulfate 325 (65 FE) MG EC tablet Take 325 mg by mouth daily.     fluticasone (FLONASE) 50 MCG/ACT nasal spray  Place 1 spray into both nostrils daily.     fluticasone-salmeterol (ADVAIR HFA) 45-21 MCG/ACT inhaler Inhale 2 puffs into the lungs 2 (two) times daily. 1 each 12   gabapentin (NEURONTIN) 300 MG capsule Take 1 capsule (300 mg total) by mouth at bedtime. 30 capsule 2   hydrOXYzine (ATARAX) 10 MG tablet TAKE 1 TABLET TWICE A DAY AS NEEDED FOR ANXIETY AS DIRECTED 60 tablet 1   loratadine (CLARITIN) 10 MG tablet Take 10 mg by mouth daily.     metoprolol tartrate (LOPRESSOR) 25 MG tablet Take 12.5 mg by mouth 2 (two) times daily.      montelukast (SINGULAIR) 10 MG tablet Take 10 mg by mouth daily.     nitroGLYCERIN (NITROSTAT) 0.4 MG SL tablet Place 0.4 mg under the tongue every 5 (five) minutes as needed for chest pain. If no relief after 3 doses call 911     Olopatadine HCl (PATADAY) 0.2 % SOLN Place 1 drop into both eyes 1 day or 1 dose. 2.5 mL 5   ondansetron (ZOFRAN) 4 MG tablet Take 1 tablet (4 mg total) by mouth every 8 (eight) hours as needed for nausea or vomiting. 20 tablet 0   oxyCODONE ER (XTAMPZA ER) 9 MG C12A Take by mouth.     potassium citrate (UROCIT-K) 10 MEQ (1080 MG) SR tablet Take 10 mEq by mouth daily.     rosuvastatin (CRESTOR) 10 MG tablet TAKE 1 TABLET ONCE A DAY 90 tablet 0   Vitamin D, Ergocalciferol, (DRISDOL) 1.25 MG (50000 UNIT) CAPS capsule TAKE 1 CAPSULE ONCE A WEEK 12 capsule 0   No current facility-administered medications for this visit.   Allergies: Allergies  Allergen Reactions   Levaquin [Levofloxacin In D5w]     Throat closes up.   Zithromax [Azithromycin]     Causes a rash   Prednisone Other (See Comments)    Hypotension   Social History: Social History   Socioeconomic History   Marital status: Widowed    Spouse name: Not on file   Number of children: 3   Years of education: Not on file   Highest education level: Not on file  Occupational History   Not on file  Tobacco Use   Smoking status: Former  Types: Cigarettes   Smokeless tobacco:  Never   Tobacco comments:    quit 35 years  Vaping Use   Vaping Use: Never used  Substance and Sexual Activity   Alcohol use: No    Alcohol/week: 0.0 standard drinks of alcohol   Drug use: No   Sexual activity: Not on file  Other Topics Concern   Not on file  Social History Narrative   Not on file   Social Determinants of Health   Financial Resource Strain: Not on file  Food Insecurity: Not on file  Transportation Needs: Not on file  Physical Activity: Not on file  Stress: Not on file  Social Connections: Not on file   Lives in a [facility] that is was built in 1947, no roaches in the house, and bed is 2 feet of the floor, not exposed to fumes chemicals or dust  Smoking: prior smoker, 2 ppd, quit 40 years ago  Occupation: disabled   Environmental History: Immunologist in the house: no Engineer, civil (consulting) in the family room: no Carpet in the bedroom: no Heating: gas Cooling: central Pet: yes 2 dogs with access to bedroom   Family History: Family History  Problem Relation Age of Onset   Other Mother    Colon cancer Father    Gout Sister    Peripheral vascular disease Brother    Prostate cancer Brother    Peripheral vascular disease Sister      ROS: All others negative except as noted per HPI.   Objective: BP (!) 156/88   Pulse 60   Resp 16   Ht 5\' 5"  (1.651 m)   Wt 178 lb 3.2 oz (80.8 kg)   LMP  (LMP Unknown)   SpO2 95%   BMI 29.65 kg/m  Body mass index is 29.65 kg/m.  General Appearance:  Alert, cooperative, no distress, appears stated age  Head:  Normocephalic, without obvious abnormality, atraumatic  Eyes:  Conjunctiva clear, EOM's intact  Nose: Nares normal, normal mucosa, no visible anterior polyps, and septum midline  Throat: Lips, tongue normal; teeth and gums normal, normal posterior oropharynx  Neck: Supple, symmetrical  Lungs:   clear to auscultation bilaterally, Respirations unlabored, no coughing  Heart:  regular rate and rhythm and no murmur,  Appears well perfused  Extremities: No edema  Skin: Skin color, texture, turgor normal, no rashes or lesions on visualized portions of skin  Neurologic: No gross deficits   The plan was reviewed with the patient/family, and all questions/concerned were addressed.  It was my pleasure to see Frances Newman today and participate in her care. Please feel free to contact me with any questions or concerns.  Sincerely,  Ferol Luz, MD Allergy & Immunology  [facility] office: [phone] [facility] office: [phone]

## 2023-03-01 NOTE — Patient Instructions (Addendum)
Chronic Rhinitis  perennial : - allergy testing today: deferred due to [facility] insurance, will get blood work first and go from there   - Prevention:  - allergen avoidance when possible - consider allergy shots as long term control of your symptoms by teaching your immune system to be more tolerant of your allergy triggers  - Symptom control: - Start Nasal Steroid Spray: Best results if used daily. - Options include Flonase (fluticasone), Nasocort (triamcinolone), Nasonex (mometasome) 1- 2 sprays in each nostril daily.  - All can be purchased over-the-counter if not covered by insurance. - Start Astelin (azelastine) 1-2 sprays in each nostril twice a day as needed for nasal congestion/itchy nose - Continue Singulair (Montelukast) 10mg  nightly.   - Discontinue if nightmares of behavior changes. - Continue Antihistamine: daily or daily as needed.   -Options include Zyrtec (Cetirizine) 10mg , Claritin (Loratadine) 10mg , Allegra (Fexofenadine) 180mg , or Xyzal (Levocetirinze) 5mg  - Can be purchased over-the-counter if not covered by insurance.  Allergic Conjunctivitis:  - Consider Allergy Eye drops-great options include Pataday (Olopatadine) or Zaditor (ketotifen) for eye symptoms daily as needed-both sold over the counter if not covered by insurance. and Rewetting Drops such as Systane,TheraTears, etc  -Avoid eye drops that say red eye relief as they may contain medications that dry out your eyes.   Mild Persistent Asthma: not well controlled  Breathing test today showed;  some inflammation in your lungs  - Start Breo 1 puff daily THIS SHOULD BE USED EVERY DAY - Rinse mouth out after use - Exhale fully before each puff - Use Albuterol (Proair/Ventolin) 2 puffs every 4-6 hours as needed for chest tightness, wheezing, or coughing - Use Albuterol (Proair/Ventolin) 2 puffs 15 minutes prior to exercise if you have symptoms with activity - Use a spacer with all inhalers - please keep track of how  often you are needing rescue inhaler Albuterol (Proair/Ventolin) as this will help guide future management - Asthma is not controlled if:  - Symptoms are occurring >2 times a week  during the day  OR  - >2 times a month nighttime awakenings  - Please call the clinic to schedule a follow up if these symptoms arise  GERD  - Continue dexilant as prescribed - Continue diet and lifestyle modifications for treatment   Follow up: we will call you with lab results and plan   Thank you so much for letting me partake in your care today.  Don't hesitate to reach out if you have any additional concerns!  Ferol Luz, MD  [facility] [place], [place]

## 2023-03-05 ENCOUNTER — Other Ambulatory Visit (HOSPITAL_COMMUNITY): Payer: Self-pay

## 2023-03-05 LAB — ALLERGEN PROFILE WITH TOTAL IGE, RESPIRATORY-AREA 2
Alternaria Alternata IgE: <0.1 kU/L
Aspergillus Fumigatus IgE: <0.1 kU/L
Bermuda Grass IgE: <0.1 kU/L
Cat Dander IgE: <0.1 kU/L
Cedar, Mountain IgE: <0.1 kU/L
Cladosporium Herbarum IgE: <0.1 kU/L
Cockroach, German IgE: <0.1 kU/L
Common Silver Birch IgE: <0.1 kU/L
Cottonwood IgE: <0.1 kU/L
D Farinae IgE: <0.1 kU/L
D Pteronyssinus IgE: <0.1 kU/L
Dog Dander IgE: <0.1 kU/L
Elm, American IgE: <0.1 kU/L
IgE (Immunoglobulin E), Serum: 3 [IU]/mL — ABNORMAL LOW (ref 6–495)
Johnson Grass IgE: <0.1 kU/L
Maple/Box Elder IgE: <0.1 kU/L
Mouse Urine IgE: <0.1 kU/L
Oak, White IgE: <0.1 kU/L
Pecan, Hickory IgE: <0.1 kU/L
Penicillium Chrysogen IgE: <0.1 kU/L
Pigweed, Rough IgE: <0.1 kU/L
Ragweed, Short IgE: <0.1 kU/L
Sheep Sorrel IgE Qn: <0.1 kU/L
Timothy Grass IgE: <0.1 kU/L
White Mulberry IgE: <0.1 kU/L

## 2023-03-05 NOTE — Progress Notes (Signed)
Blood work was negative for environmental allergies.  Would like her asthma better controlled and then we will have her return to clinic in about 4 weeks for skin testing.  Can someone let patient know?

## 2023-03-13 ENCOUNTER — Other Ambulatory Visit: Payer: Self-pay | Admitting: Internal Medicine

## 2023-03-21 DIAGNOSIS — M62571 Muscle wasting and atrophy, not elsewhere classified, right ankle and foot: Secondary | ICD-10-CM | POA: Diagnosis not present

## 2023-03-21 DIAGNOSIS — M62572 Muscle wasting and atrophy, not elsewhere classified, left ankle and foot: Secondary | ICD-10-CM | POA: Diagnosis not present

## 2023-03-26 ENCOUNTER — Other Ambulatory Visit: Payer: Self-pay | Admitting: Internal Medicine

## 2023-04-15 DIAGNOSIS — M79675 Pain in left toe(s): Secondary | ICD-10-CM | POA: Diagnosis not present

## 2023-04-15 DIAGNOSIS — B353 Tinea pedis: Secondary | ICD-10-CM | POA: Diagnosis not present

## 2023-04-15 DIAGNOSIS — B351 Tinea unguium: Secondary | ICD-10-CM | POA: Diagnosis not present

## 2023-04-16 ENCOUNTER — Encounter: Payer: Self-pay | Admitting: Internal Medicine

## 2023-04-16 ENCOUNTER — Ambulatory Visit: Payer: 59 | Admitting: Internal Medicine

## 2023-04-16 VITALS — BP 160/84 | HR 80 | Resp 18 | Ht 64.5 in | Wt 177.0 lb

## 2023-04-16 DIAGNOSIS — G894 Chronic pain syndrome: Secondary | ICD-10-CM | POA: Diagnosis not present

## 2023-04-16 DIAGNOSIS — F411 Generalized anxiety disorder: Secondary | ICD-10-CM | POA: Diagnosis not present

## 2023-04-16 DIAGNOSIS — I10 Essential (primary) hypertension: Secondary | ICD-10-CM

## 2023-04-16 HISTORY — DX: Essential (primary) hypertension: I10

## 2023-04-16 MED ORDER — ESCITALOPRAM OXALATE 20 MG PO TABS: 30.0000 mg | ORAL_TABLET | Freq: Every day | ORAL | 3 refills | Status: DC

## 2023-04-16 MED ORDER — ALPRAZOLAM 0.25 MG PO TABS: 0.2500 mg | ORAL_TABLET | Freq: Every day | ORAL | 1 refills | Status: DC | PRN

## 2023-04-16 MED ORDER — LISINOPRIL 10 MG PO TABS: 10.0000 mg | ORAL_TABLET | Freq: Every day | ORAL | 2 refills | Status: DC

## 2023-04-16 MED ORDER — XTAMPZA ER 9 MG PO C12A: 9.0000 mg | EXTENDED_RELEASE_CAPSULE | Freq: Two times a day (BID) | ORAL | 0 refills | Status: AC

## 2023-04-16 NOTE — Assessment & Plan Note (Signed)
She did not get a phone call from ortho regarding an appointment.  I will get our referral clerk to get her in next week and I want her evaluated for an ESI.  I reviewed her [place] controlled substance registry.  We will refill her Xtampza and again I would like to see her back after her ESI to see if we can wean her pain meds.  I am going to keep her on gabapentin.  This originally made her sleepy but I will just keep it at bedtime.

## 2023-04-16 NOTE — Assessment & Plan Note (Signed)
Her anxiety is improved but she still is having symptoms and it is moderate.  I am going to increase her lexapro from 20mg  to 30mg  daily.  We will give her xanax prn but cut the number of days in the month with ultimate goal to hopefully stop this.  I had a discussion with her not to mix her xanax with her Marlowe Kays and the reason why she should not do this.

## 2023-04-16 NOTE — Progress Notes (Signed)
Office Visit  Subjective   Patient ID: Frances Newman   DOB: 09-30-1946   Age: 77 y.o.   MRN: [id number]   Chief Complaint Chief Complaint  Patient presents with   Follow-up    8 week follow up     History of Present Illness Frances Newman is a 77 yo female who comes in today for followup of her chronic pain syndrome.  I initially saw her 3 months ago where she came to establish care.  On that visit, we switched her oxycodone 20mg  tabs to Xtampza 9mg  po BID.  She stated she wants to come off pain meds and go on a "regular pain med".  I asked her what that meant and she states she wanted her pain controlled with something besides an opiate.  I tried to set her up to be evaluated by ortho spine to see if they can give her another ESI since her back pain had worsened.  She states no one has ever called her.    I also added gabapentin 300mg  at night but she states she has not noticed a difference in her pain.  I felt if we could get her to have an ESI, we could start weaning her off pain meds.  Over the interim, she states she is having more pain in her right hip and her lateral right lower leg.  She has had problems with chronic pain for over the last 6-7 years.  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  She is now having right hip pain as well.  The patient states she was started on pain meds over 2 years ago and they have been going up on the dose to control her pain.  She was on oxycodone 20mg  TID but she saw her previous PCP Frances Newman 2 months ago and told him she wants to cut back on her meds and he decreased her down to oxycodone 20mg  BID.  I saw her a month ago and switched her pain meds to Xtampza 9mg  po BID.  She states this does help the pain but she has to use tylenol  to supplement and she states this makes her pain tolerable.   I have reviewed her MRI of her lumbar spine from 12/21/2015 and this showed new severe L2-L3 severe spinal stenosis and severe right and moderate left neural foraminal stenosis with postoperative changes at L3-L4 and L4-L5.  There is mild right and moderate left neural foraminal stenosis at L5-S1.  She states she did have an ESI done a few years ago in [place].  Her worst pain is her back pain that is an intermittent sharp pain and depends on what she is doing.  She states that her pain in her lower back can be a 7 out of 10 at times on the pain scale.  She states she has undergone physical therapy for her pain.  She denies any new weakness/numbness and no loss of bowel/bladder function.  Her last dose of Xtampza was this morning.  She also returns today for followup of her anxiety disorder.  On her last visit, she wondered if she had depression but upon further questioning I felt this was just anxiety and not depression.  I did increase her lexapro from 10mg   to 20mg  daily.  She still denies any panic attacks.  She is currently on xanax 0.25mg  daily prn and I only wrote her for 20 tabs with 2 refills on her last visit.  Her last dose of xanax was 2 weeks ago.  She states she still needs it as she gets nervous and jittery.  Today, she states her anxiety is moderate in intensity.  She states she was diagnosed with anxiety about 2 years ago after her husband died.  She is also on hydroxyzine 10mg  twice a day as needed which she normally takes at night.  She denies any panic attacks.  The symptoms have been present for years and her anxiety is moderate in severity. She admits to problems just with feeling anxious and some insomnia.  She denies any difficulty concentrating, difficulty performing routine daily activities, fatigue, extreme feelings of guilt, feelings of isolation, feelings of worthlessness, helpless feeling, hypersomnia, iloss of appetite,  social withdrawal, suicidal ideation, homicidal ideation, loss of interest in pleasurable activities, out of control feelings, and weight loss.,This patient feels that she is able to care for herself. She has no predisposing factors for depression or anxiety. She currently lives with her daughter who is mentally handicapped. She has no significant prior history of mental health disorders.   The patient is a 77 year old female who presents for a follow-up evaluation of hypertension. The patient has been checking her blood pressure at home. The patient's blood pressure has ranged systollically 160-170's The patient's current medications include: metoprolol 12.5mg  BID. The patient has been tolerating her medications well. The patient denies any headache, visual changes, dizziness, lightheadness, chest pain, shortness of breath, weakness/numbness, and edema. She reports there have been no other symptoms noted.      Past Medical History Past Medical History:  Diagnosis Date   Arthritis    Asthma    DDD (degenerative disc disease), thoracic    Diabetes mellitus without complication (HCC)    diet controlled/ meds were stopped   Gall bladder disease    GERD (gastroesophageal reflux disease)    Hypertension    T2DM (type 2 diabetes mellitus) (HCC)      Allergies Allergies  Allergen Reactions   Levaquin [Levofloxacin In D5w]     Throat closes up.   Zithromax [Azithromycin]     Causes a rash   Prednisone Other (See Comments)    Hypotension     Medications  Current Outpatient Medications:    escitalopram (LEXAPRO) 20 MG tablet, Take 1.5 tablets (30 mg total) by mouth daily., Disp: 135 tablet, Rfl: 3   lisinopril (ZESTRIL) 10 MG tablet, Take 1 tablet (10 mg total) by mouth daily., Disp: 30 tablet, Rfl: 2   [START ON 04/20/2023] oxyCODONE ER (XTAMPZA ER) 9 MG C12A, Take 9 mg by mouth in the morning and at bedtime., Disp: 60 capsule, Rfl: 0   albuterol (VENTOLIN HFA) 108 (90 Base) MCG/ACT  inhaler, Inhale 2 puffs into the lungs every 4 (four) hours as needed for wheezing or shortness of breath., Disp: , Rfl:    ALLERGY RELIEF 10 MG tablet, TAKE 1 TABLET ONCE A DAY, Disp: 30 tablet, Rfl: 1   ALPRAZolam (XANAX) 0.25 MG tablet, Take 1 tablet (0.25 mg total) by mouth daily as needed for anxiety., Disp: 15 tablet, Rfl: 1   aspirin 81 MG tablet, Take by mouth daily., Disp: , Rfl:    azelastine (ASTELIN) 0.1 % nasal spray, Place 1 spray into both nostrils 2 (two) times daily.  Use in each nostril as directed, Disp: 30 mL, Rfl: 12   Blood Glucose Monitoring Suppl (ACCU-CHEK GUIDE) w/Device KIT, by Does not apply route., Disp: , Rfl:    dexlansoprazole (DEXILANT) 60 MG capsule, TAKE 1 TABLET BY MOUTH DAILY, Disp: 90 capsule, Rfl: 4   diclofenac Sodium (VOLTAREN) 1 % GEL, Apply 4 g topically 2 (two) times daily., Disp: , Rfl:    donepezil (ARICEPT) 5 MG tablet, Take 5 mg by mouth at bedtime., Disp: , Rfl:    ferrous sulfate 325 (65 FE) MG EC tablet, Take 325 mg by mouth daily., Disp: , Rfl:    fluticasone (FLONASE) 50 MCG/ACT nasal spray, Place 1 spray into both nostrils daily., Disp: , Rfl:    fluticasone furoate-vilanterol (BREO ELLIPTA) 200-25 MCG/ACT AEPB, Inhale 1 puff into the lungs daily., Disp: 1 each, Rfl: 5   gabapentin (NEURONTIN) 300 MG capsule, Take 1 capsule (300 mg total) by mouth at bedtime., Disp: 30 capsule, Rfl: 2   hydrOXYzine (ATARAX) 10 MG tablet, TAKE 1 TABLET TWICE A DAY AS NEEDED FOR ANXIETY AS DIRECTED, Disp: 60 tablet, Rfl: 1   metoprolol tartrate (LOPRESSOR) 25 MG tablet, Take 12.5 mg by mouth 2 (two) times daily. , Disp: , Rfl:    montelukast (SINGULAIR) 10 MG tablet, Take 10 mg by mouth daily., Disp: , Rfl:    nitroGLYCERIN (NITROSTAT) 0.4 MG SL tablet, Place 0.4 mg under the tongue every 5 (five) minutes as needed for chest pain. If no relief after 3 doses call 911, Disp: , Rfl:    Olopatadine HCl (PATADAY) 0.2 % SOLN, Place 1 drop into both eyes 1 day or 1 dose.,  Disp: 2.5 mL, Rfl: 5   ondansetron (ZOFRAN) 4 MG tablet, Take 1 tablet (4 mg total) by mouth every 8 (eight) hours as needed for nausea or vomiting., Disp: 20 tablet, Rfl: 0   potassium chloride (MICRO-K) 10 MEQ CR capsule, TAKE 1 CAPSULE BY MOUTH ONCE DAILY, Disp: 90 capsule, Rfl: 1   potassium citrate (UROCIT-K) 10 MEQ (1080 MG) SR tablet, Take 10 mEq by mouth daily., Disp: , Rfl:    rosuvastatin (CRESTOR) 10 MG tablet, TAKE 1 TABLET ONCE A DAY, Disp: 90 tablet, Rfl: 0   Vitamin D, Ergocalciferol, (DRISDOL) 1.25 MG (50000 UNIT) CAPS capsule, TAKE 1 CAPSULE ONCE A WEEK, Disp: 12 capsule, Rfl: 0   Review of Systems Review of Systems  Constitutional:  Negative for chills and fever.  Eyes:  Negative for double vision.  Respiratory:  Negative for cough and shortness of breath.   Cardiovascular:  Negative for chest pain, palpitations and leg swelling.  Gastrointestinal:  Positive for heartburn. Negative for abdominal pain, constipation, diarrhea, nausea and vomiting.  Genitourinary:  Negative for frequency and hematuria.  Musculoskeletal:  Positive for back pain.  Skin:  Negative for itching and rash.  Neurological:  Negative for dizziness, weakness and headaches.  Psychiatric/Behavioral:  Negative for depression. The patient is nervous/anxious.        Objective:    Vitals BP (!) 160/84 (BP Location: Left Arm, Patient Position: Sitting, Cuff Size: Normal)   Pulse 80   Resp 18   Ht 5' 4.5" (1.638 m)   Wt 177 lb (80.3 kg)   LMP  (LMP Unknown)   SpO2 94%   BMI 29.91 kg/m    Physical Examination Physical Exam Constitutional:      Appearance: Normal appearance. She is not ill-appearing.  Cardiovascular:     Rate and Rhythm: Normal rate and  regular rhythm.     Pulses: Normal pulses.     Heart sounds: No murmur heard.    No friction rub. No gallop.  Pulmonary:     Effort: Pulmonary effort is normal. No respiratory distress.     Breath sounds: No wheezing, rhonchi or rales.   Abdominal:     General: Bowel sounds are normal. There is no distension.     Palpations: Abdomen is soft.     Tenderness: There is no abdominal tenderness.  Musculoskeletal:     Right lower leg: No edema.     Left lower leg: No edema.  Skin:    General: Skin is warm and dry.     Findings: No rash.  Neurological:     General: No focal deficit present.     Mental Status: She is alert and oriented to person, place, and time.  Psychiatric:        Mood and Affect: Mood normal.        Behavior: Behavior normal.        Assessment & Plan:   Chronic pain syndrome She did not get a phone call from ortho regarding an appointment.  I will get our referral clerk to get her in next week and I want her evaluated for an ESI.  I reviewed her [place] controlled substance registry.  We will refill her Xtampza and again I would like to see her back after her ESI to see if we can wean her pain meds.  I am going to keep her on gabapentin.  This originally made her sleepy but I will just keep it at bedtime.  GAD (generalized anxiety disorder) Her anxiety is improved but she still is having symptoms and it is moderate.  I am going to increase her lexapro from 20mg  to 30mg  daily.  We will give her xanax prn but cut the number of days in the month with ultimate goal to hopefully stop this.  I had a discussion with her not to mix her xanax with her Marlowe Kays and the reason why she should not do this.  Essential hypertension Her BP is not controlled.  I am going to add lisinopril 10mg  daily to her regimen with her metoprolol.    Return in about 8 weeks (around 06/11/2023) for annual.   Crist Fat, MD

## 2023-04-16 NOTE — Assessment & Plan Note (Signed)
Her BP is not controlled.  I am going to add lisinopril 10mg  daily to her regimen with her metoprolol.

## 2023-04-20 DIAGNOSIS — M62572 Muscle wasting and atrophy, not elsewhere classified, left ankle and foot: Secondary | ICD-10-CM | POA: Diagnosis not present

## 2023-04-20 DIAGNOSIS — M62571 Muscle wasting and atrophy, not elsewhere classified, right ankle and foot: Secondary | ICD-10-CM | POA: Diagnosis not present

## 2023-05-03 DIAGNOSIS — I739 Peripheral vascular disease, unspecified: Secondary | ICD-10-CM | POA: Diagnosis not present

## 2023-05-03 DIAGNOSIS — M79606 Pain in leg, unspecified: Secondary | ICD-10-CM | POA: Diagnosis not present

## 2023-05-03 DIAGNOSIS — M2041 Other hammer toe(s) (acquired), right foot: Secondary | ICD-10-CM | POA: Diagnosis not present

## 2023-05-06 ENCOUNTER — Other Ambulatory Visit: Payer: Self-pay | Admitting: Internal Medicine

## 2023-05-07 ENCOUNTER — Other Ambulatory Visit: Payer: Self-pay | Admitting: Internal Medicine

## 2023-05-09 DIAGNOSIS — M2041 Other hammer toe(s) (acquired), right foot: Secondary | ICD-10-CM | POA: Diagnosis not present

## 2023-05-09 DIAGNOSIS — M79606 Pain in leg, unspecified: Secondary | ICD-10-CM | POA: Diagnosis not present

## 2023-05-09 DIAGNOSIS — I739 Peripheral vascular disease, unspecified: Secondary | ICD-10-CM | POA: Diagnosis not present

## 2023-05-10 DIAGNOSIS — M4727 Other spondylosis with radiculopathy, lumbosacral region: Secondary | ICD-10-CM | POA: Diagnosis not present

## 2023-05-13 ENCOUNTER — Ambulatory Visit: Payer: 59 | Admitting: Internal Medicine

## 2023-05-13 ENCOUNTER — Encounter: Payer: Self-pay | Admitting: Internal Medicine

## 2023-05-13 VITALS — BP 144/82 | HR 75 | Temp 97.5°F | Resp 18 | Ht 65.0 in | Wt 179.0 lb

## 2023-05-13 DIAGNOSIS — R531 Weakness: Secondary | ICD-10-CM | POA: Diagnosis not present

## 2023-05-13 HISTORY — DX: Weakness: R53.1

## 2023-05-13 NOTE — Assessment & Plan Note (Signed)
She need home health and will arrange for her.

## 2023-05-13 NOTE — Progress Notes (Signed)
   Office Visit  Subjective   Patient ID: Frances Newman   DOB: [birth year]   Age: 77 y.o.   MRN: [id number]   Chief Complaint Chief Complaint  Patient presents with   office visit    Home health Referral      History of Present Illness 77 years old female is here for home health referral. She has been getting it for a while but her insurance want her to be evaluated for it. She has chronic pain in her back, arm and feet. She takes gabapentin and Xtampza for pain control. She says she fell twice in bathtub few months ago and she is afraid of going to bathroom alone. Her daughter lives with her and she is handicap. She can not dress herself, she need help in bathing. She also need help in cooking and house cleaning. She says that she can not cut with knife. She drive by herself to doctor office, grocery store and to the pharmacy. She use walker sometime.   Past Medical History Past Medical History:  Diagnosis Date   Arthritis    Asthma    DDD (degenerative disc disease), thoracic    Diabetes mellitus without complication (HCC)    diet controlled/ meds were stopped   Gall bladder disease    GERD (gastroesophageal reflux disease)    Hypertension    T2DM (type 2 diabetes mellitus) (HCC)      Allergies Allergies  Allergen Reactions   Levaquin [Levofloxacin In D5w]     Throat closes up.   Zithromax [Azithromycin]     Causes a rash   Prednisone Other (See Comments)    Hypotension     Review of Systems Review of Systems  Constitutional:  Positive for malaise/fatigue.  Respiratory: Negative.    Cardiovascular: Negative.   Musculoskeletal:  Positive for back pain and joint pain.  Neurological:  Positive for weakness.       Objective:    Vitals BP (!) 144/82 (BP Location: Left Arm, Patient Position: Sitting, Cuff Size: Normal)   Pulse 75   Temp (!) 97.5 F (36.4 C)   Resp 18   Ht 5\' 5"  (1.651 m)   Wt 179 lb (81.2 kg)   LMP  (LMP Unknown)   SpO2 96%   BMI 29.79 kg/m     Physical Examination Physical Exam Constitutional:      Appearance: Normal appearance. She is obese.  Cardiovascular:     Rate and Rhythm: Normal rate and regular rhythm.     Heart sounds: Normal heart sounds.  Pulmonary:     Effort: Pulmonary effort is normal.     Breath sounds: Normal breath sounds.  Neurological:     General: No focal deficit present.     Mental Status: She is alert.        Assessment & Plan:   Weakness generalized She need home health and will arrange for her.    No follow-ups on file.   Eloisa Northern, MD

## 2023-05-20 ENCOUNTER — Other Ambulatory Visit: Payer: Self-pay | Admitting: Internal Medicine

## 2023-05-21 DIAGNOSIS — J45909 Unspecified asthma, uncomplicated: Secondary | ICD-10-CM | POA: Diagnosis not present

## 2023-05-21 DIAGNOSIS — M62571 Muscle wasting and atrophy, not elsewhere classified, right ankle and foot: Secondary | ICD-10-CM | POA: Diagnosis not present

## 2023-05-21 DIAGNOSIS — I10 Essential (primary) hypertension: Secondary | ICD-10-CM | POA: Diagnosis not present

## 2023-05-21 DIAGNOSIS — M1990 Unspecified osteoarthritis, unspecified site: Secondary | ICD-10-CM | POA: Diagnosis not present

## 2023-05-21 DIAGNOSIS — K829 Disease of gallbladder, unspecified: Secondary | ICD-10-CM | POA: Diagnosis not present

## 2023-05-21 DIAGNOSIS — J449 Chronic obstructive pulmonary disease, unspecified: Secondary | ICD-10-CM | POA: Diagnosis not present

## 2023-05-21 DIAGNOSIS — M62572 Muscle wasting and atrophy, not elsewhere classified, left ankle and foot: Secondary | ICD-10-CM | POA: Diagnosis not present

## 2023-05-21 DIAGNOSIS — E119 Type 2 diabetes mellitus without complications: Secondary | ICD-10-CM | POA: Diagnosis not present

## 2023-05-21 DIAGNOSIS — F411 Generalized anxiety disorder: Secondary | ICD-10-CM | POA: Diagnosis not present

## 2023-05-21 DIAGNOSIS — G894 Chronic pain syndrome: Secondary | ICD-10-CM | POA: Diagnosis not present

## 2023-05-21 DIAGNOSIS — K219 Gastro-esophageal reflux disease without esophagitis: Secondary | ICD-10-CM | POA: Diagnosis not present

## 2023-05-22 ENCOUNTER — Other Ambulatory Visit: Payer: Self-pay | Admitting: Internal Medicine

## 2023-05-22 MED ORDER — ALPRAZOLAM 0.25 MG PO TABS: 0.2500 mg | ORAL_TABLET | Freq: Every day | ORAL | 1 refills | Status: DC | PRN

## 2023-05-24 ENCOUNTER — Other Ambulatory Visit: Payer: Self-pay | Admitting: Internal Medicine

## 2023-05-27 ENCOUNTER — Other Ambulatory Visit: Payer: Self-pay | Admitting: Internal Medicine

## 2023-05-27 DIAGNOSIS — I700 Atherosclerosis of aorta: Secondary | ICD-10-CM | POA: Diagnosis not present

## 2023-05-27 DIAGNOSIS — I739 Peripheral vascular disease, unspecified: Secondary | ICD-10-CM | POA: Diagnosis not present

## 2023-05-27 DIAGNOSIS — M79673 Pain in unspecified foot: Secondary | ICD-10-CM | POA: Diagnosis not present

## 2023-05-27 DIAGNOSIS — M79672 Pain in left foot: Secondary | ICD-10-CM | POA: Diagnosis not present

## 2023-05-27 MED ORDER — XTAMPZA ER 9 MG PO C12A: 1.0000 | EXTENDED_RELEASE_CAPSULE | Freq: Two times a day (BID) | ORAL | 0 refills | Status: DC

## 2023-05-30 ENCOUNTER — Other Ambulatory Visit: Payer: Self-pay | Admitting: Internal Medicine

## 2023-05-30 DIAGNOSIS — J449 Chronic obstructive pulmonary disease, unspecified: Secondary | ICD-10-CM | POA: Diagnosis not present

## 2023-05-30 DIAGNOSIS — K219 Gastro-esophageal reflux disease without esophagitis: Secondary | ICD-10-CM | POA: Diagnosis not present

## 2023-05-30 DIAGNOSIS — M1990 Unspecified osteoarthritis, unspecified site: Secondary | ICD-10-CM | POA: Diagnosis not present

## 2023-05-30 DIAGNOSIS — J45909 Unspecified asthma, uncomplicated: Secondary | ICD-10-CM | POA: Diagnosis not present

## 2023-05-30 DIAGNOSIS — K829 Disease of gallbladder, unspecified: Secondary | ICD-10-CM | POA: Diagnosis not present

## 2023-05-30 DIAGNOSIS — E119 Type 2 diabetes mellitus without complications: Secondary | ICD-10-CM | POA: Diagnosis not present

## 2023-05-30 DIAGNOSIS — G894 Chronic pain syndrome: Secondary | ICD-10-CM | POA: Diagnosis not present

## 2023-05-30 DIAGNOSIS — I10 Essential (primary) hypertension: Secondary | ICD-10-CM | POA: Diagnosis not present

## 2023-05-30 MED ORDER — XTAMPZA ER 9 MG PO C12A: 1.0000 | EXTENDED_RELEASE_CAPSULE | Freq: Two times a day (BID) | ORAL | 0 refills | Status: DC

## 2023-05-31 DIAGNOSIS — G894 Chronic pain syndrome: Secondary | ICD-10-CM | POA: Diagnosis not present

## 2023-05-31 DIAGNOSIS — K829 Disease of gallbladder, unspecified: Secondary | ICD-10-CM | POA: Diagnosis not present

## 2023-05-31 DIAGNOSIS — K219 Gastro-esophageal reflux disease without esophagitis: Secondary | ICD-10-CM | POA: Diagnosis not present

## 2023-05-31 DIAGNOSIS — E119 Type 2 diabetes mellitus without complications: Secondary | ICD-10-CM | POA: Diagnosis not present

## 2023-05-31 DIAGNOSIS — I10 Essential (primary) hypertension: Secondary | ICD-10-CM | POA: Diagnosis not present

## 2023-05-31 DIAGNOSIS — J45909 Unspecified asthma, uncomplicated: Secondary | ICD-10-CM | POA: Diagnosis not present

## 2023-05-31 DIAGNOSIS — J449 Chronic obstructive pulmonary disease, unspecified: Secondary | ICD-10-CM | POA: Diagnosis not present

## 2023-05-31 DIAGNOSIS — M1990 Unspecified osteoarthritis, unspecified site: Secondary | ICD-10-CM | POA: Diagnosis not present

## 2023-06-03 DIAGNOSIS — G894 Chronic pain syndrome: Secondary | ICD-10-CM | POA: Diagnosis not present

## 2023-06-03 DIAGNOSIS — J449 Chronic obstructive pulmonary disease, unspecified: Secondary | ICD-10-CM | POA: Diagnosis not present

## 2023-06-03 DIAGNOSIS — K829 Disease of gallbladder, unspecified: Secondary | ICD-10-CM | POA: Diagnosis not present

## 2023-06-03 DIAGNOSIS — E119 Type 2 diabetes mellitus without complications: Secondary | ICD-10-CM | POA: Diagnosis not present

## 2023-06-03 DIAGNOSIS — I10 Essential (primary) hypertension: Secondary | ICD-10-CM | POA: Diagnosis not present

## 2023-06-03 DIAGNOSIS — J45909 Unspecified asthma, uncomplicated: Secondary | ICD-10-CM | POA: Diagnosis not present

## 2023-06-03 DIAGNOSIS — M1990 Unspecified osteoarthritis, unspecified site: Secondary | ICD-10-CM | POA: Diagnosis not present

## 2023-06-03 DIAGNOSIS — K219 Gastro-esophageal reflux disease without esophagitis: Secondary | ICD-10-CM | POA: Diagnosis not present

## 2023-06-06 DIAGNOSIS — K219 Gastro-esophageal reflux disease without esophagitis: Secondary | ICD-10-CM | POA: Diagnosis not present

## 2023-06-06 DIAGNOSIS — J449 Chronic obstructive pulmonary disease, unspecified: Secondary | ICD-10-CM | POA: Diagnosis not present

## 2023-06-06 DIAGNOSIS — K829 Disease of gallbladder, unspecified: Secondary | ICD-10-CM | POA: Diagnosis not present

## 2023-06-06 DIAGNOSIS — E119 Type 2 diabetes mellitus without complications: Secondary | ICD-10-CM | POA: Diagnosis not present

## 2023-06-06 DIAGNOSIS — I10 Essential (primary) hypertension: Secondary | ICD-10-CM | POA: Diagnosis not present

## 2023-06-06 DIAGNOSIS — M1990 Unspecified osteoarthritis, unspecified site: Secondary | ICD-10-CM | POA: Diagnosis not present

## 2023-06-06 DIAGNOSIS — G894 Chronic pain syndrome: Secondary | ICD-10-CM | POA: Diagnosis not present

## 2023-06-06 DIAGNOSIS — J45909 Unspecified asthma, uncomplicated: Secondary | ICD-10-CM | POA: Diagnosis not present

## 2023-06-11 ENCOUNTER — Ambulatory Visit: Payer: 59 | Admitting: Internal Medicine

## 2023-06-11 ENCOUNTER — Encounter: Payer: Self-pay | Admitting: Internal Medicine

## 2023-06-11 VITALS — BP 126/80 | HR 74 | Temp 97.6°F | Resp 17 | Ht 64.0 in | Wt 180.6 lb

## 2023-06-11 DIAGNOSIS — Z6831 Body mass index (BMI) 31.0-31.9, adult: Secondary | ICD-10-CM

## 2023-06-11 DIAGNOSIS — T402X5A Adverse effect of other opioids, initial encounter: Secondary | ICD-10-CM

## 2023-06-11 DIAGNOSIS — M2041 Other hammer toe(s) (acquired), right foot: Secondary | ICD-10-CM

## 2023-06-11 DIAGNOSIS — M2042 Other hammer toe(s) (acquired), left foot: Secondary | ICD-10-CM

## 2023-06-11 DIAGNOSIS — E7800 Pure hypercholesterolemia, unspecified: Secondary | ICD-10-CM | POA: Insufficient documentation

## 2023-06-11 DIAGNOSIS — I10 Essential (primary) hypertension: Secondary | ICD-10-CM | POA: Diagnosis not present

## 2023-06-11 DIAGNOSIS — I739 Peripheral vascular disease, unspecified: Secondary | ICD-10-CM | POA: Insufficient documentation

## 2023-06-11 DIAGNOSIS — G894 Chronic pain syndrome: Secondary | ICD-10-CM

## 2023-06-11 DIAGNOSIS — I700 Atherosclerosis of aorta: Secondary | ICD-10-CM

## 2023-06-11 DIAGNOSIS — E6609 Other obesity due to excess calories: Secondary | ICD-10-CM

## 2023-06-11 DIAGNOSIS — J4530 Mild persistent asthma, uncomplicated: Secondary | ICD-10-CM

## 2023-06-11 DIAGNOSIS — K5903 Drug induced constipation: Secondary | ICD-10-CM

## 2023-06-11 DIAGNOSIS — E66811 Obesity, class 1: Secondary | ICD-10-CM

## 2023-06-11 DIAGNOSIS — Z0000 Encounter for general adult medical examination without abnormal findings: Secondary | ICD-10-CM | POA: Diagnosis not present

## 2023-06-11 DIAGNOSIS — R799 Abnormal finding of blood chemistry, unspecified: Secondary | ICD-10-CM | POA: Diagnosis not present

## 2023-06-11 DIAGNOSIS — J302 Other seasonal allergic rhinitis: Secondary | ICD-10-CM

## 2023-06-11 DIAGNOSIS — F411 Generalized anxiety disorder: Secondary | ICD-10-CM

## 2023-06-11 HISTORY — DX: Peripheral vascular disease, unspecified: I73.9

## 2023-06-11 HISTORY — DX: Other obesity due to excess calories: E66.09

## 2023-06-11 HISTORY — DX: Body mass index (BMI) 31.0-31.9, adult: Z68.31

## 2023-06-11 HISTORY — DX: Obesity, class 1: E66.811

## 2023-06-11 HISTORY — DX: Pure hypercholesterolemia, unspecified: E78.00

## 2023-06-11 HISTORY — DX: Mild persistent asthma, uncomplicated: J45.30

## 2023-06-11 MED ORDER — ALPRAZOLAM 0.25 MG PO TABS: 0.2500 mg | ORAL_TABLET | Freq: Every day | ORAL | 2 refills | Status: DC | PRN

## 2023-06-11 NOTE — Progress Notes (Addendum)
Preventive Screening-Counseling & Management     Frances Newman is a 77 y.o. female who presents for Medicare Annual/Subsequent preventive examination.  Frances Newman is a 77 yo female who comes in today for an annual wellness visit.  She is currently due for the following studies:  screening labs and optometry exam and vaccines.    Her last eye exam was 2 years ago and she states she has blurred vision at times.  She has been told she has cataracts in the past.  Her last digital screening mammogram was done on 11/29/2022 and this was normal.  She had a colonoscopy done by Frances Newman on 02/11/2023 and this showed one 8 mm polyp in the distal ascending colon that was removed with a cold snare. There was diverticulosis in the sigmoid colon and in the ascending colon.   There some non-bleeding internal hemorrhoids.   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  They found a small antral polyp and mild gastritis.  Biopsies were negative.  The patient has a history of GERD but this is controlled on dexilant.  She had a previous colonoscopy done in 08/2018 that showed a polyp and mild sigmoid diverticulosis with non-bleeding external and internal hemorrhoids.  She had a previous capsule endoscopy 06/2006 that was normal but showed an incidental lymphangiectasia.  She had a total abdominal hysterectomy at age 48.  She was on hormone therapy for maybe a year but she was then stopped.  She denied any blood clots.  The patient does not exercise.  She started smoking at age 10 and quit at age 19.  The patient does get yearly flu vaccines.  She had a prevnar 13 vaccine done on 04/23/2018.  She states she had a shingrix vaccine completed in 2024.  She has had 5 COVID-19 vaccines including 3 boosters.  The patient denies any depression or anxiety or memory loss.  She is on an ASA 81mg  daily.  The patient was seen last month by Dr. Nelson Newman where he ordered home health therapies for  generalized weakness.  She is still working therapies and she states her weakness is improving today.    Frances Newman is a 77 yo female who comes in today for followup of her chronic pain syndrome.  I initially saw her in 01/2023 where she came to establish care and at that time, we switched her oxycodone 20mg  tabs to Xtampza 9mg  po BID.  She stated she wants to come off pain meds and go on a "regular pain med".  I asked her what that meant and she states she wanted her pain controlled with something besides an opiate.  I tried to set her up to be evaluated by ortho spine to see if they can give her another ESI since her back pain had worsened.  She went to [facility] and they wanted her to get physical therapy before she could get an injection.  She states she was called by them the other day to discuss her PT but she is waiting on them to call back.  I also added gabapentin 300mg  at night in 01/2023 but she states she has not noticed a difference in her pain.  I felt if we could get her to have an ESI, we could start weaning her off pain meds.  She continues to have pain in her right hip and her lateral right lower leg.  She has had problems with chronic pain for  over the last 6-7 years.  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 The patient states she was started on pain meds over 2 years ago and they have been going up on the dose to control her pain.  She was on oxycodone 20mg  TID but she saw her previous PCP Frances Newman 2 months ago and told him she wants to cut back on her meds and he decreased her down to oxycodone 20mg  BID.  I again switched her pain meds to Xtampza 9mg  po BID.  She states this does help the pain but she has to use tylenol to supplement and she states this makes her  pain tolerable.   I have reviewed her MRI of her lumbar spine from 12/21/2015 and this showed new severe L2-L3 severe spinal stenosis and severe right and moderate left neural foraminal stenosis with postoperative changes at L3-L4 and L4-L5.  There is mild right and moderate left neural foraminal stenosis at L5-S1.  She states she did have an ESI done a few years ago in [place].  Her worst pain is her back pain that is an intermittent sharp pain and depends on what she is doing.  She states that her pain in her lower back can be a 7 out of 10 at times on the pain scale.  She states she has undergone physical therapy for her pain.  She denies any new weakness/numbness and no loss of bowel/bladder function.  Her last dose of Xtampza was this morning.   She also returns today for followup of her anxiety disorder.  This past year, she wondered if she had depression but upon further questioning on previous visits, I felt this was just anxiety and not depression.  I did increase her lexapro from 20mg  to 30mg  daily.  She still denies any panic attacks.  She is currently on xanax 0.25mg  daily prn and I only wrote her for 20 tabs with 2 refills on her last visit.  Her last dose of xanax was 2 weeks ago.  She states she still needs it as she gets nervous and jittery.  Today, she states her anxiety is moderate in intensity.  She states she was diagnosed with anxiety about 2 years ago after her husband died.  She is also on hydroxyzine 10mg  twice a day as needed which she normally takes at night.  She denies any panic attacks.  The symptoms have been present for years and her anxiety is moderate in severity. She admits to problems just with feeling anxious and some insomnia.  She denies any difficulty concentrating, difficulty performing routine daily activities, fatigue, extreme feelings of guilt, feelings of isolation, feelings of worthlessness, helpless feeling, hypersomnia, loss of appetite, social withdrawal, suicidal  ideation, homicidal ideation, loss of interest in pleasurable activities, out of control feelings, and weight loss.,This patient feels that she is able to care for herself. She has no predisposing factors for depression or anxiety. She currently lives with her daughter who is mentally handicapped. She has no significant prior history of mental health disorders.    The patient is a 77 year old female who presents for a follow-up evaluation of hypertension.  The patient was diagnosed with HTN about 20 years ago.  On her last visit, her BP was not  controlled and I added lisinopril to her regimen.  The patient has not been checking her blood pressure at home. The patient's current medications include: metoprolol 12.5mg  BID and lisinopril 10mg  daily. The patient has been tolerating her medications well. The patient denies any headache, visual changes, dizziness, lightheadness, chest pain, shortness of breath, weakness/numbness, and edema. She reports there have been no other symptoms noted.    I saw the patient in 01/2023 and felt she had some seasonal allergies.  I referred her to Allergy/Immunology at that time.  Again, she started with respiratory tract symptoms in 01/2023 where she was having chest congestion with cough productive of green yellow sputum.  She went to see Frances Newman first and she was placed on a steriod shot and albuterol nebulization and obtain a CXR.  The patient tells me there was no pneumonia at that time.  This was not clearing up and she went to urgent care next and they told her it was coming from her head and her lungs were clear.  The patient was placed on a prednisone taper.  She has been taking flonase nasal spray but she stopped this when she was taking her prednisone.  Today, she has more white productive cough with congestion in her "neck" with SOB and wheezing at night, sinus congestion, white nasal discharge, with post nasal drip.  She does have nausea but she has seen Frances Newman and  he believes this is due to her opiods (see below).  There is no fevers, chills, headache, vomiting, diarrhea. Alleviating factors: mucinex since last week. The patient's past medical history is notable for COPD/asthma and seasonal allergies.  She did see Allergy on 02/2023 and they felt she had chronic rhinitis where they wanted to do allergy testing. They placed her on nasal steriod spray and astelin nasal spray and asked her to continue on singulair and continue on an OTC antihistamine like zyrtec which she is on.  They also felt she had allergic conjunctivitis and she could consider an allergy eye drop.  They also felt she had mild persistent asthma.   She was started on breo and albuterol HFA as needed.  Today, she states her breathing is doing a lot better and she does not get out of breath as much.    The patient has also been followed by GI with Frances Newman where she had some abdominal pain earlier in the year and she was seen in 12/2022.   02/2023.  He felt she has RLQ abdominal pain due to opioid induced constipation.  A CT scan of her abd/pelvis was done on 12/21/2022 and this showed no no acute findings or explanation for the patient's symptoms.  There was no evidence of colonic malignancy or metastatic disease.   Previously demonstrated enlarged ileocolonic mesenteric lymph nodes have decreased in size and are now within physiologic limits.  She had aortic and branch vessel atherosclerosis with luminal narrowing of the superior mesenteric artery. There was no evidence of large vessel occlusion or ischemic bowel.  There was postsurgical changes in the lower lumbar spine with chronic anterolisthesis and mild foraminal narrowing bilaterally at L4-5.  There was aortic atherosclerosis.  She then went on to proceed with a colonoscopy performed on 02/11/2023 that showed one 8 mm polyp in the distal ascending colon, removed with a cold snare.   There was diverticulosis in the sigmoid colon and in the ascending colon.   There was non- bleeding internal hemorrhoids.  He recommended to reduce narcotics  as able.    She is also being followed by podiatry where she tells me she has hammertoes.  They are looking to do surgery and she was last seen by Dr. Marylene Land (no notes seen in [facility]) about 2 months ago.  They did a preoperative workup per the patient and this included a CTA aorta/bifem on 05/27/2023 that showed no acute CT finding.  There was multilevel peripheral arterial disease, including: aortic atherosclerosis, mild iliac arterial disease without high-grade stenosis or occlusion, moderate bilateral femoropopliteal disease without evidence of occlusion. There are developing short segment stenosis in the bilateral above knee popliteal artery secondary to atheromatous plaque.  There is developing bilateral tibial arterial disease, with 2 vessel runoff bilaterally. The bilateral posterior tibial artery appear occluded proximally with distal reconstitution.  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  The patient does have a history of hypercholesterolemia.  Since her last visit, she has not had any problems.  She denies any abdominal pain, nausea, vomiting, fatigue, or myalgias.  She is currently on crestor 10mg  at bedtime.  She ate at 8AM this morning.  The patient tells me that she has been on aricept for "a while".  She is on aricept 10mg  daily.  When asked why she was started on this, she states she does not know.  I asked her if she had a problem with memory and she states no.  Her MMSE today on 06/11/2023 was a 29/30.      Are there smokers in your home (other than you)? No  Risk Factors Current exercise habits:  as above   Dietary issues discussed: none   Depression Screen (Note: if answer to either of the following is "Yes", a more complete depression screening is indicated)   Over the past two weeks, have  you felt down, depressed or hopeless? No  Over the past two weeks, have you felt little interest or pleasure in doing things? No  Have you lost interest or pleasure in daily life? No  Do you often feel hopeless? No  Do you cry easily over simple problems? No  Activities of Daily Living In your present state of health, do you have any difficulty performing the following activities?:  Driving? No Managing money?  No Feeding yourself? No Getting from bed to chair? No Climbing a flight of stairs? No Preparing food and eating?: No Bathing or showering? No Getting dressed: No Getting to the toilet? No Using the toilet:No Moving around from place to place: No In the past year have you fallen or had a near fall?:Yes - she fell in the bathtub with a slip 6-8 months ago without injury   Are you sexually active?  No  Do you have more than one partner?  No  Hearing Difficulties: Yes Do you often ask people to speak up or repeat themselves? Yes Do you experience ringing or noises in your ears? No Do you have difficulty understanding soft or whispered voices? Yes   Do you feel that you have a problem with memory? No  Do you often misplace items? No  Do you feel safe at home?  Yes  Cognitive Testing  Alert? Yes  Normal Appearance?Yes  Oriented to person? Yes  Place? Yes   Time? Yes  Recall of three objects?  Yes  Can perform simple calculations? Yes  Displays appropriate judgment?Yes  Can read the correct time from a watch  face?Yes  Fall Risk Prevention  Any stairs in or around the home? Yes  If so, are there any without handrails? Yes  Home free of loose throw rugs in walkways, pet beds, electrical cords, etc? Yes  Adequate lighting in your home to reduce risk of falls? Yes  Use of a cane, walker or w/c? Yes - She uses a walker   Time Up and Go  Was the test performed? Yes .  Length of time to ambulate 10 feet: 20 sec.   Gait slow and steady without use of assistive  device    Advanced Directives have been discussed with the patient? Yes   List the Names of Other Physician/Practitioners you currently use: Patient Care Team: Crist Fat, MD as PCP - General (Internal Medicine)    Past Medical History:  Diagnosis Date   Arthritis    Asthma    DDD (degenerative disc disease), thoracic    Diabetes mellitus without complication (HCC)    diet controlled/ meds were stopped   Gall bladder disease    GERD (gastroesophageal reflux disease)    Hypertension    T2DM (type 2 diabetes mellitus) (HCC)     Past Surgical History:  Procedure Laterality Date   ANTERIOR CRUCIATE LIGAMENT REPAIR  2004   right replaced   APPENDECTOMY  1972   BACK SURGERY     2 back surgeries   CHOLECYSTECTOMY  2015   COLONOSCOPY  10/20/2010   Mild sigmoid diverticulosis. Small internal hemorrhoids.    ESOPHAGOGASTRODUODENOSCOPY  02/21/2017   Presbyesophagus status esophageal dilataion. Small antral polyps polypectomy.    FOOT SURGERY     FOOT SURGERY     Bil   HAND SURGERY     Had CTR on both hands/ 2 surgeries on right hand   kidney stones  2006   KNEE SURGERY     OVARY SURGERY     SHOULDER SURGERY     Bil shoulder surgery   TONSILLECTOMY  1966   TOTAL ABDOMINAL HYSTERECTOMY        Current Medications  Current Outpatient Medications  Medication Sig Dispense Refill   albuterol (VENTOLIN HFA) 108 (90 Base) MCG/ACT inhaler Inhale 2 puffs into the lungs every 4 (four) hours as needed for wheezing or shortness of breath.     ALPRAZolam (XANAX) 0.25 MG tablet Take 1 tablet (0.25 mg total) by mouth daily as needed for anxiety. 20 tablet 2   aspirin 81 MG tablet Take by mouth daily.     azelastine (ASTELIN) 0.1 % nasal spray Place 1 spray into both nostrils 2 (two) times daily. Use in each nostril as directed 30 mL 12   Blood Glucose Monitoring Suppl (ACCU-CHEK GUIDE) w/Device KIT by Does not apply route.     dexlansoprazole (DEXILANT) 60 MG capsule TAKE 1 TABLET  BY MOUTH DAILY 90 capsule 4   diclofenac Sodium (VOLTAREN) 1 % GEL Apply 4 g topically 2 (two) times daily.     escitalopram (LEXAPRO) 20 MG tablet Take 1.5 tablets (30 mg total) by mouth daily. 135 tablet 3   ferrous sulfate 325 (65 FE) MG EC tablet Take 325 mg by mouth daily.     fluticasone (FLONASE) 50 MCG/ACT nasal spray Place 1 spray into both nostrils daily.     fluticasone furoate-vilanterol (BREO ELLIPTA) 200-25 MCG/ACT AEPB Inhale 1 puff into the lungs daily. 1 each 5   gabapentin (NEURONTIN) 300 MG capsule TAKE 1 CAPSULE BY MOUTH AT BEDTIME. 30 capsule 1  hydrOXYzine (ATARAX) 10 MG tablet TAKE 1 TABLET TWICE A DAY AS NEEDED FOR ANXIETY AS DIRECTED 60 tablet 0   lisinopril (ZESTRIL) 10 MG tablet Take 1 tablet (10 mg total) by mouth daily. 30 tablet 2   loratadine (CLARITIN) 10 MG tablet TAKE 1 TABLET ONCE A DAY 30 tablet 0   metoprolol tartrate (LOPRESSOR) 25 MG tablet TAKE 1/2 TABLET BY MOUTH TWICE DAILY 90 tablet 1   montelukast (SINGULAIR) 10 MG tablet TAKE 1 TABLET BY MOUTH ONCE A DAY 90 tablet 3   nitroGLYCERIN (NITROSTAT) 0.4 MG SL tablet Place 0.4 mg under the tongue every 5 (five) minutes as needed for chest pain. If no relief after 3 doses call 911     Olopatadine HCl (PATADAY) 0.2 % SOLN Place 1 drop into both eyes 1 day or 1 dose. 2.5 mL 5   ondansetron (ZOFRAN) 4 MG tablet Take 1 tablet (4 mg total) by mouth every 8 (eight) hours as needed for nausea or vomiting. 20 tablet 0   oxyCODONE ER (XTAMPZA ER) 9 MG C12A Take 1 capsule by mouth in the morning and at bedtime. 60 capsule 0   potassium chloride (MICRO-K) 10 MEQ CR capsule TAKE 1 CAPSULE BY MOUTH ONCE DAILY 90 capsule 1   potassium citrate (UROCIT-K) 10 MEQ (1080 MG) SR tablet Take 10 mEq by mouth daily.     rosuvastatin (CRESTOR) 10 MG tablet TAKE 1 TABLET ONCE A DAY 90 tablet 0   Vitamin D, Ergocalciferol, (DRISDOL) 1.25 MG (50000 UNIT) CAPS capsule TAKE 1 CAPSULE ONCE A WEEK 12 capsule 0   No current  facility-administered medications for this visit.    Allergies Levaquin [levofloxacin in d5w], Zithromax [azithromycin], and Prednisone   Social History Social History   Tobacco Use   Smoking status: Former    Types: Cigarettes   Smokeless tobacco: Never   Tobacco comments:    quit 35 years  Substance Use Topics   Alcohol use: No    Alcohol/week: 0.0 standard drinks of alcohol     Review of Systems Review of Systems  Constitutional:  Negative for chills, fever and malaise/fatigue.  Eyes:  Positive for blurred vision. Negative for double vision.  Respiratory:  Positive for cough. Negative for hemoptysis, shortness of breath and wheezing.   Cardiovascular:  Negative for chest pain, palpitations and leg swelling.  Gastrointestinal:  Negative for abdominal pain, blood in stool, constipation, diarrhea, heartburn, melena, nausea and vomiting.  Genitourinary:  Negative for frequency.  Musculoskeletal:  Negative for myalgias.  Skin:  Negative for itching and rash.  Neurological:  Negative for dizziness, weakness and headaches.  Endo/Heme/Allergies:  Negative for polydipsia.  Psychiatric/Behavioral:  Negative for depression and memory loss.      Physical Exam:      Body mass index is 31 kg/m. BP 126/80   Pulse 74   Temp 97.6 F (36.4 C)   Resp 17   Ht 5\' 4"  (1.626 m)   Wt 180 lb 9.6 oz (81.9 kg)   LMP  (LMP Unknown)   SpO2 97%   BMI 31.00 kg/m   Physical Exam Constitutional:      Appearance: Normal appearance. She is not ill-appearing.  HENT:     Head: Normocephalic and atraumatic.     Right Ear: Tympanic membrane, ear canal and external ear normal.     Left Ear: Tympanic membrane, ear canal and external ear normal.     Nose: Nose normal. No congestion or rhinorrhea.  Mouth/Throat:     Mouth: Mucous membranes are moist.     Pharynx: Oropharynx is clear. No posterior oropharyngeal erythema.  Eyes:     General: No scleral icterus.    Conjunctiva/sclera:  Conjunctivae normal.     Pupils: Pupils are equal, round, and reactive to light.  Neck:     Thyroid: No thyromegaly.     Vascular: No carotid bruit.  Cardiovascular:     Rate and Rhythm: Normal rate and regular rhythm.     Pulses: Normal pulses.     Heart sounds: Normal heart sounds. No murmur heard.    No friction rub. No gallop.  Pulmonary:     Effort: Pulmonary effort is normal. No respiratory distress.     Breath sounds: Normal breath sounds. No wheezing, rhonchi or rales.  Abdominal:     General: Abdomen is flat. Bowel sounds are normal. There is no distension.     Palpations: Abdomen is soft.     Tenderness: There is no abdominal tenderness.  Musculoskeletal:     Cervical back: Normal range of motion. No tenderness.     Right lower leg: No edema.     Left lower leg: No edema.     Comments: No clubbing or cyanosis  Lymphadenopathy:     Cervical: No cervical adenopathy.  Skin:    General: Skin is warm and dry.     Findings: No rash.  Neurological:     General: No focal deficit present.     Mental Status: She is alert and oriented to person, place, and time.     Comments: CN II-XII grossly intact  Psychiatric:        Mood and Affect: Mood normal.        Behavior: Behavior normal.      Assessment:      Chronic pain syndrome  GAD (generalized anxiety disorder)  Essential hypertension  PAD (peripheral artery disease) (HCC) - Plan: CBC with Differential/Platelet, CMP14 + Anion Gap, Hemoglobin A1c, Lipid panel, TSH  Hypercholesterolemia - Plan: CBC with Differential/Platelet, CMP14 + Anion Gap, Hemoglobin A1c, Lipid panel, TSH  Mild persistent asthma, unspecified whether complicated  Seasonal allergies  BMI 31.0-31.9,adult  Class 1 obesity due to excess calories with serious comorbidity and body mass index (BMI) of 31.0 to 31.9 in adult - Plan: CBC with Differential/Platelet, CMP14 + Anion Gap, Hemoglobin A1c, Lipid panel, TSH  Aortic atherosclerosis  (HCC)  Therapeutic opioid induced constipation  Hammertoes of both feet    Plan:     During the course of the visit the patient was educated and counseled about appropriate screening and preventive services including:   Pneumococcal vaccine  Influenza vaccine Colorectal cancer screening Advanced directives: discussed  Diet review for nutrition referral? Yes ____  Not Indicated __X__   Patient Instructions (the written plan) was given to the patient.  PAD (peripheral artery disease) (HCC) She had a preoperative CT scan of her abd/pelvis was done on 12/21/2022 that showed aortic and branch vessel atherosclerosis with luminal narrowing of the superior mesenteric artery. There was no evidence of large vessel occlusion or ischemic bowel.  There was also aortic atherosclerosis.  We will check her FLP today and continue to work on risk factor modification.  She is on an ASA.  Essential hypertension Her BP is controlled.  We will continue to monitor.  Aortic atherosclerosis (HCC) We will check her FLP as per plan above.  Mild persistent asthma Allergy saw her and started her on breo and  albuterol HFA as needed.  Therapeutic opioid induced constipation I want he to take fiber as needed.  She does not seem to have any problems today.  She can followup with GI as directed.  Hammertoes of both feet I do not have her notes from podiatry but she tells me they are looking to do upcoming surgery.  Seasonal allergies She has seen allergy and immunology and we will contiue her medications they have recommended.  Hypercholesterolemia We will check a FLP at this time.  GAD (generalized anxiety disorder) Her anxiety is stable.  We will continue to follow.  Class 1 obesity due to excess calories with serious comorbidity and body mass index (BMI) of 31.0 to 31.9 in adult I want her to eat healthy, exercise and be active and lose weight.  Chronic pain syndrome She is getting therapies  before [facility] will consider an ESI per the patient.  We will continue her current meds.  BMI 31.0-31.9,adult Plan as above.   Prevention Health maintenance discussed.  We will obtain some yearly labs.  Medicare Attestation I have personally reviewed: The patient's medical and social history Their use of alcohol, tobacco or illicit drugs Their current medications and supplements The patient's functional ability including ADLs,fall risks, home safety risks, cognitive, and hearing and visual impairment Diet and physical activities Evidence for depression or mood disorders  The patient's weight, height, and BMI have been recorded in the chart.  I have made referrals, counseling, and provided education to the patient based on review of the above and I have provided the patient with a written personalized care plan for preventive services.     Crist Fat, MD   06/12/2023

## 2023-06-12 DIAGNOSIS — I700 Atherosclerosis of aorta: Secondary | ICD-10-CM

## 2023-06-12 DIAGNOSIS — M2041 Other hammer toe(s) (acquired), right foot: Secondary | ICD-10-CM | POA: Insufficient documentation

## 2023-06-12 DIAGNOSIS — T402X5A Adverse effect of other opioids, initial encounter: Secondary | ICD-10-CM | POA: Insufficient documentation

## 2023-06-12 HISTORY — DX: Adverse effect of other opioids, initial encounter: T40.2X5A

## 2023-06-12 HISTORY — DX: Atherosclerosis of aorta: I70.0

## 2023-06-12 HISTORY — DX: Other hammer toe(s) (acquired), right foot: M20.41

## 2023-06-12 LAB — CBC WITH DIFFERENTIAL/PLATELET
Basophils Absolute: 0.1 10*3/uL (ref 0.0–0.2)
Basos: 1 %
EOS (ABSOLUTE): 0.2 10*3/uL (ref 0.0–0.4)
Eos: 2 %
Hematocrit: 39.5 % (ref 34.0–46.6)
Hemoglobin: 12 g/dL (ref 11.1–15.9)
Immature Grans (Abs): 0 10*3/uL (ref 0.0–0.1)
Immature Granulocytes: 0 %
Lymphocytes Absolute: 1.6 10*3/uL (ref 0.7–3.1)
Lymphs: 15 %
MCH: 28.1 pg (ref 26.6–33.0)
MCHC: 30.4 g/dL — ABNORMAL LOW (ref 31.5–35.7)
MCV: 93 fL (ref 79–97)
Monocytes Absolute: 0.6 10*3/uL (ref 0.1–0.9)
Monocytes: 6 %
Neutrophils Absolute: 8.2 10*3/uL — ABNORMAL HIGH (ref 1.4–7.0)
Neutrophils: 76 %
Platelets: 247 10*3/uL (ref 150–450)
RBC: 4.27 x10E6/uL (ref 3.77–5.28)
RDW: 12.8 % (ref 11.7–15.4)
WBC: 10.6 10*3/uL (ref 3.4–10.8)

## 2023-06-12 LAB — TSH: TSH: 1.09 u[IU]/mL (ref 0.450–4.500)

## 2023-06-12 LAB — CMP14 + ANION GAP
ALT: 17 IU/L (ref 0–32)
AST: 26 IU/L (ref 0–40)
Albumin: 4.6 g/dL (ref 3.8–4.8)
Alkaline Phosphatase: 73 IU/L (ref 44–121)
Anion Gap: 13 mmol/L (ref 10.0–18.0)
BUN/Creatinine Ratio: 22 (ref 12–28)
BUN: 20 mg/dL (ref 8–27)
Bilirubin Total: <0.2 mg/dL (ref 0.0–1.2)
CO2: 20 mmol/L (ref 20–29)
Calcium: 10 mg/dL (ref 8.7–10.3)
Chloride: 110 mmol/L — ABNORMAL HIGH (ref 96–106)
Creatinine, Ser: 0.9 mg/dL (ref 0.57–1.00)
Globulin, Total: 2.2 g/dL (ref 1.5–4.5)
Glucose: 90 mg/dL (ref 70–99)
Potassium: 5.6 mmol/L — ABNORMAL HIGH (ref 3.5–5.2)
Sodium: 143 mmol/L (ref 134–144)
Total Protein: 6.8 g/dL (ref 6.0–8.5)
eGFR: 67 mL/min/{1.73_m2} (ref >59)

## 2023-06-12 LAB — LIPID PANEL
Chol/HDL Ratio: 2.3 ratio (ref 0.0–4.4)
Cholesterol, Total: 189 mg/dL (ref 100–199)
HDL: 83 mg/dL (ref >39)
LDL Chol Calc (NIH): 88 mg/dL (ref 0–99)
Triglycerides: 100 mg/dL (ref 0–149)
VLDL Cholesterol Cal: 18 mg/dL (ref 5–40)

## 2023-06-12 LAB — HEMOGLOBIN A1C
Est. average glucose Bld gHb Est-mCnc: 111 mg/dL
Hgb A1c MFr Bld: 5.5 % (ref 4.8–5.6)

## 2023-06-12 NOTE — Assessment & Plan Note (Signed)
I want he to take fiber as needed.  She does not seem to have any problems today.  She can followup with GI as directed.

## 2023-06-12 NOTE — Assessment & Plan Note (Signed)
Her anxiety is stable.  We will continue to follow.

## 2023-06-12 NOTE — Assessment & Plan Note (Signed)
Her BP is controlled.  We will continue to monitor.

## 2023-06-12 NOTE — Assessment & Plan Note (Signed)
Allergy saw her and started her on breo and albuterol HFA as needed.

## 2023-06-12 NOTE — Assessment & Plan Note (Signed)
She is getting therapies before [facility] consider an [facility] per the patient.  We will continue her current meds.

## 2023-06-12 NOTE — Assessment & Plan Note (Signed)
Plan as above.  

## 2023-06-12 NOTE — Assessment & Plan Note (Signed)
She has seen allergy and immunology and we will contiue her medications they have recommended.

## 2023-06-12 NOTE — Assessment & Plan Note (Signed)
I want her to eat healthy, exercise and be active and lose weight.

## 2023-06-12 NOTE — Assessment & Plan Note (Signed)
I do not have her notes from podiatry but she tells me they are looking to do upcoming surgery.

## 2023-06-12 NOTE — Assessment & Plan Note (Signed)
We will check a FLP at this time.

## 2023-06-12 NOTE — Assessment & Plan Note (Signed)
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 There was no evidence of large vessel occlusion or ischemic bowel.  There was also aortic atherosclerosis.  We will check her FLP today and continue to work on risk factor modification.  She is on an ASA.

## 2023-06-12 NOTE — Assessment & Plan Note (Signed)
We will check her FLP as per plan above.

## 2023-06-13 ENCOUNTER — Other Ambulatory Visit: Payer: Self-pay

## 2023-06-13 DIAGNOSIS — K829 Disease of gallbladder, unspecified: Secondary | ICD-10-CM | POA: Diagnosis not present

## 2023-06-13 DIAGNOSIS — J45909 Unspecified asthma, uncomplicated: Secondary | ICD-10-CM | POA: Diagnosis not present

## 2023-06-13 DIAGNOSIS — G894 Chronic pain syndrome: Secondary | ICD-10-CM | POA: Diagnosis not present

## 2023-06-13 DIAGNOSIS — I10 Essential (primary) hypertension: Secondary | ICD-10-CM | POA: Diagnosis not present

## 2023-06-13 DIAGNOSIS — K219 Gastro-esophageal reflux disease without esophagitis: Secondary | ICD-10-CM | POA: Diagnosis not present

## 2023-06-13 DIAGNOSIS — E119 Type 2 diabetes mellitus without complications: Secondary | ICD-10-CM | POA: Diagnosis not present

## 2023-06-13 DIAGNOSIS — J449 Chronic obstructive pulmonary disease, unspecified: Secondary | ICD-10-CM | POA: Diagnosis not present

## 2023-06-13 DIAGNOSIS — M1990 Unspecified osteoarthritis, unspecified site: Secondary | ICD-10-CM | POA: Diagnosis not present

## 2023-06-13 NOTE — Progress Notes (Signed)
RX Stopped

## 2023-06-13 NOTE — Progress Notes (Signed)
Her K+ level is high. Stop her supplemental K+ and repeat a BMP in 7 days.   Called and spoke with patient about lab results and potasium being high. Made aware to discontinue potasium med, and the need to do labs in 7 days. Patient acknowledged

## 2023-06-18 ENCOUNTER — Other Ambulatory Visit: Payer: Self-pay | Admitting: Internal Medicine

## 2023-06-18 ENCOUNTER — Ambulatory Visit: Payer: 59

## 2023-06-19 DIAGNOSIS — J45909 Unspecified asthma, uncomplicated: Secondary | ICD-10-CM | POA: Diagnosis not present

## 2023-06-19 DIAGNOSIS — J449 Chronic obstructive pulmonary disease, unspecified: Secondary | ICD-10-CM | POA: Diagnosis not present

## 2023-06-19 DIAGNOSIS — K219 Gastro-esophageal reflux disease without esophagitis: Secondary | ICD-10-CM | POA: Diagnosis not present

## 2023-06-19 DIAGNOSIS — G894 Chronic pain syndrome: Secondary | ICD-10-CM | POA: Diagnosis not present

## 2023-06-19 DIAGNOSIS — K829 Disease of gallbladder, unspecified: Secondary | ICD-10-CM | POA: Diagnosis not present

## 2023-06-19 DIAGNOSIS — E119 Type 2 diabetes mellitus without complications: Secondary | ICD-10-CM | POA: Diagnosis not present

## 2023-06-19 DIAGNOSIS — M1990 Unspecified osteoarthritis, unspecified site: Secondary | ICD-10-CM | POA: Diagnosis not present

## 2023-06-19 DIAGNOSIS — I10 Essential (primary) hypertension: Secondary | ICD-10-CM | POA: Diagnosis not present

## 2023-06-20 ENCOUNTER — Ambulatory Visit: Payer: 59

## 2023-06-20 DIAGNOSIS — J449 Chronic obstructive pulmonary disease, unspecified: Secondary | ICD-10-CM | POA: Diagnosis not present

## 2023-06-20 DIAGNOSIS — E119 Type 2 diabetes mellitus without complications: Secondary | ICD-10-CM | POA: Diagnosis not present

## 2023-06-20 DIAGNOSIS — M1990 Unspecified osteoarthritis, unspecified site: Secondary | ICD-10-CM | POA: Diagnosis not present

## 2023-06-20 DIAGNOSIS — G894 Chronic pain syndrome: Secondary | ICD-10-CM | POA: Diagnosis not present

## 2023-06-20 DIAGNOSIS — K219 Gastro-esophageal reflux disease without esophagitis: Secondary | ICD-10-CM | POA: Diagnosis not present

## 2023-06-20 DIAGNOSIS — E875 Hyperkalemia: Secondary | ICD-10-CM | POA: Diagnosis not present

## 2023-06-20 DIAGNOSIS — I10 Essential (primary) hypertension: Secondary | ICD-10-CM | POA: Diagnosis not present

## 2023-06-20 DIAGNOSIS — J45909 Unspecified asthma, uncomplicated: Secondary | ICD-10-CM | POA: Diagnosis not present

## 2023-06-20 DIAGNOSIS — K829 Disease of gallbladder, unspecified: Secondary | ICD-10-CM | POA: Diagnosis not present

## 2023-06-20 NOTE — Progress Notes (Signed)
Nurse visit for bloodwork

## 2023-06-21 DIAGNOSIS — M62572 Muscle wasting and atrophy, not elsewhere classified, left ankle and foot: Secondary | ICD-10-CM | POA: Diagnosis not present

## 2023-06-21 DIAGNOSIS — M62571 Muscle wasting and atrophy, not elsewhere classified, right ankle and foot: Secondary | ICD-10-CM | POA: Diagnosis not present

## 2023-06-21 LAB — BASIC METABOLIC PANEL
BUN/Creatinine Ratio: 26 (ref 12–28)
BUN: 31 mg/dL — ABNORMAL HIGH (ref 8–27)
CO2: 19 mmol/L — ABNORMAL LOW (ref 20–29)
Calcium: 9.1 mg/dL (ref 8.7–10.3)
Chloride: 112 mmol/L — ABNORMAL HIGH (ref 96–106)
Creatinine, Ser: 1.2 mg/dL — ABNORMAL HIGH (ref 0.57–1.00)
Glucose: 86 mg/dL (ref 70–99)
Potassium: 4.5 mmol/L (ref 3.5–5.2)
Sodium: 146 mmol/L — ABNORMAL HIGH (ref 134–144)
eGFR: 47 mL/min/{1.73_m2} — ABNORMAL LOW (ref >59)

## 2023-06-25 DIAGNOSIS — G894 Chronic pain syndrome: Secondary | ICD-10-CM | POA: Diagnosis not present

## 2023-06-25 DIAGNOSIS — K829 Disease of gallbladder, unspecified: Secondary | ICD-10-CM | POA: Diagnosis not present

## 2023-06-25 DIAGNOSIS — E119 Type 2 diabetes mellitus without complications: Secondary | ICD-10-CM | POA: Diagnosis not present

## 2023-06-25 DIAGNOSIS — K219 Gastro-esophageal reflux disease without esophagitis: Secondary | ICD-10-CM | POA: Diagnosis not present

## 2023-06-25 DIAGNOSIS — M1990 Unspecified osteoarthritis, unspecified site: Secondary | ICD-10-CM | POA: Diagnosis not present

## 2023-06-25 DIAGNOSIS — J45909 Unspecified asthma, uncomplicated: Secondary | ICD-10-CM | POA: Diagnosis not present

## 2023-06-25 DIAGNOSIS — J449 Chronic obstructive pulmonary disease, unspecified: Secondary | ICD-10-CM | POA: Diagnosis not present

## 2023-06-25 DIAGNOSIS — I10 Essential (primary) hypertension: Secondary | ICD-10-CM | POA: Diagnosis not present

## 2023-06-27 ENCOUNTER — Other Ambulatory Visit: Payer: Self-pay | Admitting: Internal Medicine

## 2023-06-27 DIAGNOSIS — E119 Type 2 diabetes mellitus without complications: Secondary | ICD-10-CM | POA: Diagnosis not present

## 2023-06-27 DIAGNOSIS — G894 Chronic pain syndrome: Secondary | ICD-10-CM | POA: Diagnosis not present

## 2023-06-27 DIAGNOSIS — K219 Gastro-esophageal reflux disease without esophagitis: Secondary | ICD-10-CM | POA: Diagnosis not present

## 2023-06-27 DIAGNOSIS — K829 Disease of gallbladder, unspecified: Secondary | ICD-10-CM | POA: Diagnosis not present

## 2023-06-27 DIAGNOSIS — I10 Essential (primary) hypertension: Secondary | ICD-10-CM | POA: Diagnosis not present

## 2023-06-27 DIAGNOSIS — M1990 Unspecified osteoarthritis, unspecified site: Secondary | ICD-10-CM | POA: Diagnosis not present

## 2023-06-27 DIAGNOSIS — J45909 Unspecified asthma, uncomplicated: Secondary | ICD-10-CM | POA: Diagnosis not present

## 2023-06-27 DIAGNOSIS — J449 Chronic obstructive pulmonary disease, unspecified: Secondary | ICD-10-CM | POA: Diagnosis not present

## 2023-07-01 ENCOUNTER — Other Ambulatory Visit: Payer: Self-pay | Admitting: Internal Medicine

## 2023-07-01 DIAGNOSIS — M5416 Radiculopathy, lumbar region: Secondary | ICD-10-CM | POA: Diagnosis not present

## 2023-07-02 ENCOUNTER — Other Ambulatory Visit: Payer: Self-pay | Admitting: Internal Medicine

## 2023-07-02 ENCOUNTER — Other Ambulatory Visit: Payer: Self-pay

## 2023-07-02 ENCOUNTER — Other Ambulatory Visit: Payer: Self-pay | Admitting: Student

## 2023-07-02 MED ORDER — VITAMIN D (ERGOCALCIFEROL) 1.25 MG (50000 UNIT) PO CAPS: 50000.0000 [IU] | ORAL_CAPSULE | ORAL | 2 refills | Status: DC

## 2023-07-02 MED ORDER — XTAMPZA ER 9 MG PO C12A: 1.0000 | EXTENDED_RELEASE_CAPSULE | Freq: Two times a day (BID) | ORAL | 0 refills | Status: DC

## 2023-07-02 NOTE — Progress Notes (Signed)
Requested Prescriptions   Signed Prescriptions Disp Refills   oxyCODONE ER (XTAMPZA ER) 9 MG C12A 60 capsule 0    Sig: Take 1 capsule by mouth in the morning and at bedtime.   It appears that the medication was pended for refill but the patient reports it not being sent. I have sent this refill to [facility].

## 2023-07-03 DIAGNOSIS — B353 Tinea pedis: Secondary | ICD-10-CM | POA: Diagnosis not present

## 2023-07-03 DIAGNOSIS — M79675 Pain in left toe(s): Secondary | ICD-10-CM | POA: Diagnosis not present

## 2023-07-03 DIAGNOSIS — L84 Corns and callosities: Secondary | ICD-10-CM | POA: Diagnosis not present

## 2023-07-03 DIAGNOSIS — B351 Tinea unguium: Secondary | ICD-10-CM | POA: Diagnosis not present

## 2023-07-04 DIAGNOSIS — K219 Gastro-esophageal reflux disease without esophagitis: Secondary | ICD-10-CM | POA: Diagnosis not present

## 2023-07-04 DIAGNOSIS — E119 Type 2 diabetes mellitus without complications: Secondary | ICD-10-CM | POA: Diagnosis not present

## 2023-07-04 DIAGNOSIS — J45909 Unspecified asthma, uncomplicated: Secondary | ICD-10-CM | POA: Diagnosis not present

## 2023-07-04 DIAGNOSIS — K829 Disease of gallbladder, unspecified: Secondary | ICD-10-CM | POA: Diagnosis not present

## 2023-07-04 DIAGNOSIS — I10 Essential (primary) hypertension: Secondary | ICD-10-CM | POA: Diagnosis not present

## 2023-07-04 DIAGNOSIS — J449 Chronic obstructive pulmonary disease, unspecified: Secondary | ICD-10-CM | POA: Diagnosis not present

## 2023-07-04 DIAGNOSIS — M1990 Unspecified osteoarthritis, unspecified site: Secondary | ICD-10-CM | POA: Diagnosis not present

## 2023-07-04 DIAGNOSIS — G894 Chronic pain syndrome: Secondary | ICD-10-CM | POA: Diagnosis not present

## 2023-07-10 DIAGNOSIS — J449 Chronic obstructive pulmonary disease, unspecified: Secondary | ICD-10-CM | POA: Diagnosis not present

## 2023-07-10 DIAGNOSIS — J45909 Unspecified asthma, uncomplicated: Secondary | ICD-10-CM | POA: Diagnosis not present

## 2023-07-10 DIAGNOSIS — G894 Chronic pain syndrome: Secondary | ICD-10-CM | POA: Diagnosis not present

## 2023-07-10 DIAGNOSIS — E119 Type 2 diabetes mellitus without complications: Secondary | ICD-10-CM | POA: Diagnosis not present

## 2023-07-10 DIAGNOSIS — I10 Essential (primary) hypertension: Secondary | ICD-10-CM | POA: Diagnosis not present

## 2023-07-10 DIAGNOSIS — K219 Gastro-esophageal reflux disease without esophagitis: Secondary | ICD-10-CM | POA: Diagnosis not present

## 2023-07-10 DIAGNOSIS — M1990 Unspecified osteoarthritis, unspecified site: Secondary | ICD-10-CM | POA: Diagnosis not present

## 2023-07-10 DIAGNOSIS — K829 Disease of gallbladder, unspecified: Secondary | ICD-10-CM | POA: Diagnosis not present

## 2023-07-11 DIAGNOSIS — K829 Disease of gallbladder, unspecified: Secondary | ICD-10-CM | POA: Diagnosis not present

## 2023-07-11 DIAGNOSIS — G894 Chronic pain syndrome: Secondary | ICD-10-CM | POA: Diagnosis not present

## 2023-07-11 DIAGNOSIS — K219 Gastro-esophageal reflux disease without esophagitis: Secondary | ICD-10-CM | POA: Diagnosis not present

## 2023-07-11 DIAGNOSIS — M1990 Unspecified osteoarthritis, unspecified site: Secondary | ICD-10-CM | POA: Diagnosis not present

## 2023-07-11 DIAGNOSIS — J449 Chronic obstructive pulmonary disease, unspecified: Secondary | ICD-10-CM | POA: Diagnosis not present

## 2023-07-11 DIAGNOSIS — E119 Type 2 diabetes mellitus without complications: Secondary | ICD-10-CM | POA: Diagnosis not present

## 2023-07-11 DIAGNOSIS — J45909 Unspecified asthma, uncomplicated: Secondary | ICD-10-CM | POA: Diagnosis not present

## 2023-07-11 DIAGNOSIS — I10 Essential (primary) hypertension: Secondary | ICD-10-CM | POA: Diagnosis not present

## 2023-07-15 DIAGNOSIS — K219 Gastro-esophageal reflux disease without esophagitis: Secondary | ICD-10-CM | POA: Diagnosis not present

## 2023-07-15 DIAGNOSIS — E119 Type 2 diabetes mellitus without complications: Secondary | ICD-10-CM | POA: Diagnosis not present

## 2023-07-15 DIAGNOSIS — M1990 Unspecified osteoarthritis, unspecified site: Secondary | ICD-10-CM | POA: Diagnosis not present

## 2023-07-15 DIAGNOSIS — J449 Chronic obstructive pulmonary disease, unspecified: Secondary | ICD-10-CM | POA: Diagnosis not present

## 2023-07-15 DIAGNOSIS — J45909 Unspecified asthma, uncomplicated: Secondary | ICD-10-CM | POA: Diagnosis not present

## 2023-07-15 DIAGNOSIS — G894 Chronic pain syndrome: Secondary | ICD-10-CM | POA: Diagnosis not present

## 2023-07-15 DIAGNOSIS — K829 Disease of gallbladder, unspecified: Secondary | ICD-10-CM | POA: Diagnosis not present

## 2023-07-15 DIAGNOSIS — I10 Essential (primary) hypertension: Secondary | ICD-10-CM | POA: Diagnosis not present

## 2023-07-15 NOTE — Progress Notes (Signed)
I don't know where the rest of her labs are. She may have some CKD. We will follow this. She needs to drink plenty of fluids and stay hydrated and avoid NSAIDS.   Patient aware of labs per [facility]

## 2023-07-17 DIAGNOSIS — E119 Type 2 diabetes mellitus without complications: Secondary | ICD-10-CM | POA: Diagnosis not present

## 2023-07-17 DIAGNOSIS — J45909 Unspecified asthma, uncomplicated: Secondary | ICD-10-CM | POA: Diagnosis not present

## 2023-07-17 DIAGNOSIS — J449 Chronic obstructive pulmonary disease, unspecified: Secondary | ICD-10-CM | POA: Diagnosis not present

## 2023-07-17 DIAGNOSIS — I10 Essential (primary) hypertension: Secondary | ICD-10-CM | POA: Diagnosis not present

## 2023-07-17 DIAGNOSIS — K219 Gastro-esophageal reflux disease without esophagitis: Secondary | ICD-10-CM | POA: Diagnosis not present

## 2023-07-17 DIAGNOSIS — K829 Disease of gallbladder, unspecified: Secondary | ICD-10-CM | POA: Diagnosis not present

## 2023-07-17 DIAGNOSIS — M1990 Unspecified osteoarthritis, unspecified site: Secondary | ICD-10-CM | POA: Diagnosis not present

## 2023-07-17 DIAGNOSIS — G894 Chronic pain syndrome: Secondary | ICD-10-CM | POA: Diagnosis not present

## 2023-07-18 DIAGNOSIS — K219 Gastro-esophageal reflux disease without esophagitis: Secondary | ICD-10-CM | POA: Diagnosis not present

## 2023-07-18 DIAGNOSIS — G894 Chronic pain syndrome: Secondary | ICD-10-CM | POA: Diagnosis not present

## 2023-07-18 DIAGNOSIS — E119 Type 2 diabetes mellitus without complications: Secondary | ICD-10-CM | POA: Diagnosis not present

## 2023-07-18 DIAGNOSIS — M1990 Unspecified osteoarthritis, unspecified site: Secondary | ICD-10-CM | POA: Diagnosis not present

## 2023-07-18 DIAGNOSIS — I10 Essential (primary) hypertension: Secondary | ICD-10-CM | POA: Diagnosis not present

## 2023-07-18 DIAGNOSIS — K829 Disease of gallbladder, unspecified: Secondary | ICD-10-CM | POA: Diagnosis not present

## 2023-07-18 DIAGNOSIS — J45909 Unspecified asthma, uncomplicated: Secondary | ICD-10-CM | POA: Diagnosis not present

## 2023-07-18 DIAGNOSIS — J449 Chronic obstructive pulmonary disease, unspecified: Secondary | ICD-10-CM | POA: Diagnosis not present

## 2023-07-19 DIAGNOSIS — H25813 Combined forms of age-related cataract, bilateral: Secondary | ICD-10-CM | POA: Diagnosis not present

## 2023-07-20 DIAGNOSIS — I10 Essential (primary) hypertension: Secondary | ICD-10-CM | POA: Diagnosis not present

## 2023-07-20 DIAGNOSIS — G894 Chronic pain syndrome: Secondary | ICD-10-CM | POA: Diagnosis not present

## 2023-07-20 DIAGNOSIS — M1990 Unspecified osteoarthritis, unspecified site: Secondary | ICD-10-CM | POA: Diagnosis not present

## 2023-07-20 DIAGNOSIS — F411 Generalized anxiety disorder: Secondary | ICD-10-CM | POA: Diagnosis not present

## 2023-07-21 DIAGNOSIS — M62572 Muscle wasting and atrophy, not elsewhere classified, left ankle and foot: Secondary | ICD-10-CM | POA: Diagnosis not present

## 2023-07-21 DIAGNOSIS — M62571 Muscle wasting and atrophy, not elsewhere classified, right ankle and foot: Secondary | ICD-10-CM | POA: Diagnosis not present

## 2023-07-22 ENCOUNTER — Other Ambulatory Visit: Payer: Self-pay | Admitting: Internal Medicine

## 2023-07-23 ENCOUNTER — Other Ambulatory Visit: Payer: Self-pay | Admitting: Internal Medicine

## 2023-07-23 MED ORDER — GABAPENTIN 300 MG PO CAPS: 300.0000 mg | ORAL_CAPSULE | Freq: Every day | ORAL | 1 refills | Status: DC

## 2023-07-24 ENCOUNTER — Other Ambulatory Visit: Payer: Self-pay | Admitting: Internal Medicine

## 2023-07-24 DIAGNOSIS — J45909 Unspecified asthma, uncomplicated: Secondary | ICD-10-CM | POA: Diagnosis not present

## 2023-07-24 DIAGNOSIS — K829 Disease of gallbladder, unspecified: Secondary | ICD-10-CM | POA: Diagnosis not present

## 2023-07-24 DIAGNOSIS — G894 Chronic pain syndrome: Secondary | ICD-10-CM | POA: Diagnosis not present

## 2023-07-24 DIAGNOSIS — M1990 Unspecified osteoarthritis, unspecified site: Secondary | ICD-10-CM | POA: Diagnosis not present

## 2023-07-24 DIAGNOSIS — E119 Type 2 diabetes mellitus without complications: Secondary | ICD-10-CM | POA: Diagnosis not present

## 2023-07-24 DIAGNOSIS — I10 Essential (primary) hypertension: Secondary | ICD-10-CM | POA: Diagnosis not present

## 2023-07-24 DIAGNOSIS — K219 Gastro-esophageal reflux disease without esophagitis: Secondary | ICD-10-CM | POA: Diagnosis not present

## 2023-07-25 DIAGNOSIS — K829 Disease of gallbladder, unspecified: Secondary | ICD-10-CM | POA: Diagnosis not present

## 2023-07-25 DIAGNOSIS — J45909 Unspecified asthma, uncomplicated: Secondary | ICD-10-CM | POA: Diagnosis not present

## 2023-07-25 DIAGNOSIS — E119 Type 2 diabetes mellitus without complications: Secondary | ICD-10-CM | POA: Diagnosis not present

## 2023-07-25 DIAGNOSIS — G894 Chronic pain syndrome: Secondary | ICD-10-CM | POA: Diagnosis not present

## 2023-07-25 DIAGNOSIS — K219 Gastro-esophageal reflux disease without esophagitis: Secondary | ICD-10-CM | POA: Diagnosis not present

## 2023-07-25 DIAGNOSIS — I10 Essential (primary) hypertension: Secondary | ICD-10-CM | POA: Diagnosis not present

## 2023-07-25 DIAGNOSIS — M1990 Unspecified osteoarthritis, unspecified site: Secondary | ICD-10-CM | POA: Diagnosis not present

## 2023-07-29 DIAGNOSIS — K829 Disease of gallbladder, unspecified: Secondary | ICD-10-CM | POA: Diagnosis not present

## 2023-07-29 DIAGNOSIS — I10 Essential (primary) hypertension: Secondary | ICD-10-CM | POA: Diagnosis not present

## 2023-07-29 DIAGNOSIS — K219 Gastro-esophageal reflux disease without esophagitis: Secondary | ICD-10-CM | POA: Diagnosis not present

## 2023-07-29 DIAGNOSIS — J45909 Unspecified asthma, uncomplicated: Secondary | ICD-10-CM | POA: Diagnosis not present

## 2023-07-29 DIAGNOSIS — E119 Type 2 diabetes mellitus without complications: Secondary | ICD-10-CM | POA: Diagnosis not present

## 2023-07-29 DIAGNOSIS — M1990 Unspecified osteoarthritis, unspecified site: Secondary | ICD-10-CM | POA: Diagnosis not present

## 2023-07-29 DIAGNOSIS — G894 Chronic pain syndrome: Secondary | ICD-10-CM | POA: Diagnosis not present

## 2023-07-30 DIAGNOSIS — M47816 Spondylosis without myelopathy or radiculopathy, lumbar region: Secondary | ICD-10-CM | POA: Diagnosis not present

## 2023-07-30 DIAGNOSIS — M545 Low back pain, unspecified: Secondary | ICD-10-CM | POA: Diagnosis not present

## 2023-08-02 DIAGNOSIS — J45909 Unspecified asthma, uncomplicated: Secondary | ICD-10-CM | POA: Diagnosis not present

## 2023-08-02 DIAGNOSIS — K829 Disease of gallbladder, unspecified: Secondary | ICD-10-CM | POA: Diagnosis not present

## 2023-08-02 DIAGNOSIS — E119 Type 2 diabetes mellitus without complications: Secondary | ICD-10-CM | POA: Diagnosis not present

## 2023-08-02 DIAGNOSIS — K219 Gastro-esophageal reflux disease without esophagitis: Secondary | ICD-10-CM | POA: Diagnosis not present

## 2023-08-02 DIAGNOSIS — G894 Chronic pain syndrome: Secondary | ICD-10-CM | POA: Diagnosis not present

## 2023-08-02 DIAGNOSIS — I10 Essential (primary) hypertension: Secondary | ICD-10-CM | POA: Diagnosis not present

## 2023-08-02 DIAGNOSIS — M1990 Unspecified osteoarthritis, unspecified site: Secondary | ICD-10-CM | POA: Diagnosis not present

## 2023-08-05 ENCOUNTER — Other Ambulatory Visit: Payer: Self-pay | Admitting: Internal Medicine

## 2023-08-05 ENCOUNTER — Other Ambulatory Visit: Payer: Self-pay | Admitting: Student

## 2023-08-05 DIAGNOSIS — G894 Chronic pain syndrome: Secondary | ICD-10-CM | POA: Diagnosis not present

## 2023-08-05 DIAGNOSIS — K829 Disease of gallbladder, unspecified: Secondary | ICD-10-CM | POA: Diagnosis not present

## 2023-08-05 DIAGNOSIS — J45909 Unspecified asthma, uncomplicated: Secondary | ICD-10-CM | POA: Diagnosis not present

## 2023-08-05 DIAGNOSIS — I10 Essential (primary) hypertension: Secondary | ICD-10-CM | POA: Diagnosis not present

## 2023-08-05 DIAGNOSIS — M1990 Unspecified osteoarthritis, unspecified site: Secondary | ICD-10-CM | POA: Diagnosis not present

## 2023-08-05 DIAGNOSIS — K219 Gastro-esophageal reflux disease without esophagitis: Secondary | ICD-10-CM | POA: Diagnosis not present

## 2023-08-05 DIAGNOSIS — E119 Type 2 diabetes mellitus without complications: Secondary | ICD-10-CM | POA: Diagnosis not present

## 2023-08-05 MED ORDER — XTAMPZA ER 9 MG PO C12A: 1.0000 | EXTENDED_RELEASE_CAPSULE | Freq: Two times a day (BID) | ORAL | 0 refills | Status: DC

## 2023-08-07 DIAGNOSIS — I10 Essential (primary) hypertension: Secondary | ICD-10-CM | POA: Diagnosis not present

## 2023-08-07 DIAGNOSIS — J45909 Unspecified asthma, uncomplicated: Secondary | ICD-10-CM | POA: Diagnosis not present

## 2023-08-07 DIAGNOSIS — E119 Type 2 diabetes mellitus without complications: Secondary | ICD-10-CM | POA: Diagnosis not present

## 2023-08-07 DIAGNOSIS — G894 Chronic pain syndrome: Secondary | ICD-10-CM | POA: Diagnosis not present

## 2023-08-07 DIAGNOSIS — M1990 Unspecified osteoarthritis, unspecified site: Secondary | ICD-10-CM | POA: Diagnosis not present

## 2023-08-07 DIAGNOSIS — K219 Gastro-esophageal reflux disease without esophagitis: Secondary | ICD-10-CM | POA: Diagnosis not present

## 2023-08-07 DIAGNOSIS — K829 Disease of gallbladder, unspecified: Secondary | ICD-10-CM | POA: Diagnosis not present

## 2023-08-12 ENCOUNTER — Other Ambulatory Visit: Payer: Self-pay | Admitting: Internal Medicine

## 2023-08-12 DIAGNOSIS — H02834 Dermatochalasis of left upper eyelid: Secondary | ICD-10-CM | POA: Diagnosis not present

## 2023-08-12 DIAGNOSIS — H2512 Age-related nuclear cataract, left eye: Secondary | ICD-10-CM | POA: Diagnosis not present

## 2023-08-12 DIAGNOSIS — H18413 Arcus senilis, bilateral: Secondary | ICD-10-CM | POA: Diagnosis not present

## 2023-08-12 DIAGNOSIS — H2513 Age-related nuclear cataract, bilateral: Secondary | ICD-10-CM | POA: Diagnosis not present

## 2023-08-12 DIAGNOSIS — H02831 Dermatochalasis of right upper eyelid: Secondary | ICD-10-CM | POA: Diagnosis not present

## 2023-08-12 MED ORDER — ALPRAZOLAM 0.25 MG PO TABS: 0.2500 mg | ORAL_TABLET | Freq: Every day | ORAL | 2 refills | Status: DC | PRN

## 2023-08-13 DIAGNOSIS — E119 Type 2 diabetes mellitus without complications: Secondary | ICD-10-CM | POA: Diagnosis not present

## 2023-08-13 DIAGNOSIS — J45909 Unspecified asthma, uncomplicated: Secondary | ICD-10-CM | POA: Diagnosis not present

## 2023-08-13 DIAGNOSIS — K829 Disease of gallbladder, unspecified: Secondary | ICD-10-CM | POA: Diagnosis not present

## 2023-08-13 DIAGNOSIS — M1990 Unspecified osteoarthritis, unspecified site: Secondary | ICD-10-CM | POA: Diagnosis not present

## 2023-08-13 DIAGNOSIS — K219 Gastro-esophageal reflux disease without esophagitis: Secondary | ICD-10-CM | POA: Diagnosis not present

## 2023-08-13 DIAGNOSIS — I10 Essential (primary) hypertension: Secondary | ICD-10-CM | POA: Diagnosis not present

## 2023-08-13 DIAGNOSIS — G894 Chronic pain syndrome: Secondary | ICD-10-CM | POA: Diagnosis not present

## 2023-08-14 ENCOUNTER — Ambulatory Visit: Payer: 59 | Admitting: Internal Medicine

## 2023-08-15 DIAGNOSIS — M1990 Unspecified osteoarthritis, unspecified site: Secondary | ICD-10-CM | POA: Diagnosis not present

## 2023-08-15 DIAGNOSIS — J45909 Unspecified asthma, uncomplicated: Secondary | ICD-10-CM | POA: Diagnosis not present

## 2023-08-15 DIAGNOSIS — I10 Essential (primary) hypertension: Secondary | ICD-10-CM | POA: Diagnosis not present

## 2023-08-15 DIAGNOSIS — K829 Disease of gallbladder, unspecified: Secondary | ICD-10-CM | POA: Diagnosis not present

## 2023-08-15 DIAGNOSIS — E119 Type 2 diabetes mellitus without complications: Secondary | ICD-10-CM | POA: Diagnosis not present

## 2023-08-15 DIAGNOSIS — G894 Chronic pain syndrome: Secondary | ICD-10-CM | POA: Diagnosis not present

## 2023-08-15 DIAGNOSIS — K219 Gastro-esophageal reflux disease without esophagitis: Secondary | ICD-10-CM | POA: Diagnosis not present

## 2023-08-16 ENCOUNTER — Ambulatory Visit: Payer: 59 | Admitting: Internal Medicine

## 2023-08-19 ENCOUNTER — Ambulatory Visit: Payer: 59 | Admitting: Internal Medicine

## 2023-08-19 ENCOUNTER — Encounter: Payer: Self-pay | Admitting: Internal Medicine

## 2023-08-19 DIAGNOSIS — M79602 Pain in left arm: Secondary | ICD-10-CM

## 2023-08-19 DIAGNOSIS — G44311 Acute post-traumatic headache, intractable: Secondary | ICD-10-CM | POA: Insufficient documentation

## 2023-08-19 DIAGNOSIS — M79601 Pain in right arm: Secondary | ICD-10-CM | POA: Insufficient documentation

## 2023-08-19 DIAGNOSIS — R0789 Other chest pain: Secondary | ICD-10-CM | POA: Insufficient documentation

## 2023-08-19 DIAGNOSIS — I6523 Occlusion and stenosis of bilateral carotid arteries: Secondary | ICD-10-CM | POA: Diagnosis not present

## 2023-08-19 DIAGNOSIS — M542 Cervicalgia: Secondary | ICD-10-CM | POA: Diagnosis not present

## 2023-08-19 DIAGNOSIS — Z041 Encounter for examination and observation following transport accident: Secondary | ICD-10-CM | POA: Diagnosis not present

## 2023-08-19 DIAGNOSIS — R0781 Pleurodynia: Secondary | ICD-10-CM | POA: Insufficient documentation

## 2023-08-19 DIAGNOSIS — M50323 Other cervical disc degeneration at C6-C7 level: Secondary | ICD-10-CM | POA: Diagnosis not present

## 2023-08-19 HISTORY — DX: Pain in left arm: M79.602

## 2023-08-19 HISTORY — DX: Pleurodynia: R07.81

## 2023-08-19 HISTORY — DX: Acute post-traumatic headache, intractable: G44.311

## 2023-08-19 HISTORY — DX: Cervicalgia: M54.2

## 2023-08-19 HISTORY — DX: Pain in right arm: M79.601

## 2023-08-19 MED ORDER — CYCLOBENZAPRINE HCL 5 MG PO TABS: 5.0000 mg | ORAL_TABLET | Freq: Three times a day (TID) | ORAL | 0 refills | Status: DC | PRN

## 2023-08-19 MED ORDER — METHYLPREDNISOLONE 4 MG PO TBPK: ORAL_TABLET | ORAL | 0 refills | Status: DC

## 2023-08-19 MED ORDER — DICLOFENAC SODIUM 1 % EX GEL: 4.0000 g | Freq: Two times a day (BID) | CUTANEOUS | 0 refills | Status: DC

## 2023-08-19 NOTE — Assessment & Plan Note (Signed)
Plan as below.

## 2023-08-19 NOTE — Assessment & Plan Note (Signed)
We will obtain xrays of her bilateral proximal arms to rule out fracture.  I think though this is MSK pain on exam.  We will give her a medrol dose pak and flexeril.
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
## 2023-08-19 NOTE — Assessment & Plan Note (Signed)
Plan as above.  

## 2023-08-21 ENCOUNTER — Other Ambulatory Visit: Payer: Self-pay | Admitting: Internal Medicine

## 2023-08-21 DIAGNOSIS — M62572 Muscle wasting and atrophy, not elsewhere classified, left ankle and foot: Secondary | ICD-10-CM | POA: Diagnosis not present

## 2023-08-21 DIAGNOSIS — M47816 Spondylosis without myelopathy or radiculopathy, lumbar region: Secondary | ICD-10-CM | POA: Diagnosis not present

## 2023-08-21 DIAGNOSIS — M62571 Muscle wasting and atrophy, not elsewhere classified, right ankle and foot: Secondary | ICD-10-CM | POA: Diagnosis not present

## 2023-08-26 ENCOUNTER — Encounter: Payer: Self-pay | Admitting: Internal Medicine

## 2023-09-02 ENCOUNTER — Other Ambulatory Visit: Payer: Self-pay | Admitting: Internal Medicine

## 2023-09-02 MED ORDER — XTAMPZA ER 9 MG PO C12A: 1.0000 | EXTENDED_RELEASE_CAPSULE | Freq: Two times a day (BID) | ORAL | 0 refills | Status: DC

## 2023-09-09 ENCOUNTER — Other Ambulatory Visit: Payer: Self-pay | Admitting: Internal Medicine

## 2023-09-12 ENCOUNTER — Encounter: Payer: Self-pay | Admitting: Internal Medicine

## 2023-09-12 ENCOUNTER — Ambulatory Visit: Payer: 59 | Admitting: Internal Medicine

## 2023-09-12 VITALS — BP 134/88 | HR 67 | Temp 98.6°F | Resp 17 | Ht 64.0 in | Wt 192.0 lb

## 2023-09-12 DIAGNOSIS — K219 Gastro-esophageal reflux disease without esophagitis: Secondary | ICD-10-CM

## 2023-09-12 DIAGNOSIS — R079 Chest pain, unspecified: Secondary | ICD-10-CM

## 2023-09-12 DIAGNOSIS — I10 Essential (primary) hypertension: Secondary | ICD-10-CM | POA: Diagnosis not present

## 2023-09-12 DIAGNOSIS — G894 Chronic pain syndrome: Secondary | ICD-10-CM

## 2023-09-12 HISTORY — DX: Gastro-esophageal reflux disease without esophagitis: K21.9

## 2023-09-12 HISTORY — DX: Chest pain, unspecified: R07.9

## 2023-09-12 MED ORDER — ISOSORBIDE MONONITRATE ER 30 MG PO TB24: 30.0000 mg | ORAL_TABLET | Freq: Every day | ORAL | 2 refills | Status: DC

## 2023-09-12 MED ORDER — FAMOTIDINE 40 MG PO TABS: 40.0000 mg | ORAL_TABLET | Freq: Every evening | ORAL | 1 refills | Status: DC

## 2023-09-12 NOTE — Progress Notes (Signed)
Office Visit  Subjective   Patient ID: Frances Newman   DOB: 10-Feb-1946   Age: 77 y.o.   MRN: [id number]   Chief Complaint Chief Complaint  Patient presents with   Follow-up     History of Present Illness Frances Newman has a history of chronic pain syndrome.  Since her last visit, there has been no change in her chronic pain.  I initially saw her in 01/2023 where she came to establish care and at that time, we switched her oxycodone 20mg  tabs to Xtampza 9mg  po BID.  She stated she wants to come off pain meds and go on a "regular pain med".  I asked her what that meant and she states she wanted her pain controlled with something besides an opiate.  I tried to set her up to be evaluated by ortho spine to see if they can give her another ESI since her back pain had worsened.  She went to [facility] and they wanted her to get physical therapy before she could get an injection.  I also added gabapentin 300mg  at night in 01/2023 but she states she has not noticed a difference in her pain.  I felt if we could get her to have an ESI, we could start weaning her off pain meds.  She continues to have pain in her right hip and her lateral right lower leg.  She has had problems with chronic pain for over the last 6-7 years.  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 The patient states she was started on pain meds over 2 years ago and they have been going up on the dose to control her pain.  She was on oxycodone 20mg  TID but she saw her previous PCP Dr. Ardelle Park 2 months ago and told him she wants to cut back on her meds and he decreased her down to oxycodone 20mg  BID.  I again switched her pain meds to Xtampza 9mg  po BID.  She states this does help the pain but she has to use tylenol to  supplement and she states this makes her pain tolerable.   I have reviewed her MRI of her lumbar spine from 12/21/2015 and this showed new severe L2-L3 severe spinal stenosis and severe right and moderate left neural foraminal stenosis with postoperative changes at L3-L4 and L4-L5.  There is mild right and moderate left neural foraminal stenosis at L5-S1.  She states she did have an ESI done a few years ago in [place].  Her worst pain is her back pain that is an intermittent sharp pain and depends on what she is doing.  She states she is currently in physical therapy but they are getting to discharge her.  She denies any new weakness/numbness and no loss of bowel/bladder function.  Her last dose of Xtampza was this morning.  The patient is a 77 year old female who presents for a follow-up evaluation of hypertension.  The patient was diagnosed with HTN about 20 years ago.  On her last visit, her BP was not controlled and I added lisinopril to her regimen.  The patient has not been checking her blood pressure at home. The patient's current medications include: metoprolol 12.5mg  BID and lisinopril 10mg  daily. The patient has been tolerating  her medications well. The patient denies any headache, visual changes, dizziness, lightheadness, chest pain, shortness of breath, weakness/numbness, and edema. She reports there have been no other symptoms noted.   The patient states she is also having occasional left sided chest pain that started 7-8 months ago.  She will get chest pain that will radiate down her left arm.  She states it can occur at any time and she wonders whether this could be reflux as she has heart burn and swelling of her abdomen that occurs after she eats.  However, the chest pain can occur at any time and will last for a few minutes.  She tells me today she was told at [facility] that she had a heart attack several years ago.  She had left over nitroglycerin which she states has helped with her chest pain in  the past.   She will get SOB at times with the chest pain but no palpitations, n/v, diaphoresis or syncope.  She has never seen a cardiologist.  She is currently on metoprolol, crestor, ASA, and lisinopril.     Past Medical History Past Medical History:  Diagnosis Date   Arthritis    Asthma    DDD (degenerative disc disease), thoracic    Diabetes mellitus without complication (HCC)    diet controlled/ meds were stopped   Gall bladder disease    GERD (gastroesophageal reflux disease)    Hypertension    T2DM (type 2 diabetes mellitus) (HCC)      Allergies Allergies  Allergen Reactions   Levaquin [Levofloxacin In D5w]     Throat closes up.   Zithromax [Azithromycin]     Causes a rash   Prednisone Other (See Comments)    Hypotension     Medications  Current Outpatient Medications:    albuterol (VENTOLIN HFA) 108 (90 Base) MCG/ACT inhaler, Inhale 2 puffs into the lungs every 4 (four) hours as needed for wheezing or shortness of breath., Disp: , Rfl:    ALPRAZolam (XANAX) 0.25 MG tablet, Take 1 tablet (0.25 mg total) by mouth daily as needed for anxiety., Disp: 20 tablet, Rfl: 2   aspirin 81 MG tablet, Take by mouth daily., Disp: , Rfl:    azelastine (ASTELIN) 0.1 % nasal spray, Place 1 spray into both nostrils 2 (two) times daily. Use in each nostril as directed, Disp: 30 mL, Rfl: 12   Blood Glucose Monitoring Suppl (ACCU-CHEK GUIDE) w/Device KIT, by Does not apply route., Disp: , Rfl:    cyclobenzaprine (FLEXERIL) 5 MG tablet, Take 1 tablet (5 mg total) by mouth 3 (three) times daily as needed for muscle spasms., Disp: 15 tablet, Rfl: 0   dexlansoprazole (DEXILANT) 60 MG capsule, TAKE 1 TABLET BY MOUTH DAILY, Disp: 90 capsule, Rfl: 4   diclofenac Sodium (VOLTAREN) 1 % GEL, Apply 4 g topically 2 (two) times daily., Disp: 100 g, Rfl: 0   escitalopram (LEXAPRO) 20 MG tablet, Take 1.5 tablets (30 mg total) by mouth daily., Disp: 135 tablet, Rfl: 3   ferrous sulfate 325 (65 FE) MG EC  tablet, Take 325 mg by mouth daily., Disp: , Rfl:    fluticasone (FLONASE) 50 MCG/ACT nasal spray, Place 1 spray into both nostrils daily., Disp: , Rfl:    fluticasone furoate-vilanterol (BREO ELLIPTA) 200-25 MCG/ACT AEPB, Inhale 1 puff into the lungs daily., Disp: 1 each, Rfl: 5   gabapentin (NEURONTIN) 300 MG capsule, Take 1 capsule (300 mg total) by mouth at bedtime., Disp: 90 capsule, Rfl: 1  hydrOXYzine (ATARAX) 10 MG tablet, TAKE 1 TABLET BY MOUTH TWICE A DAY AS NEEDED FOR ANXIETY AS DIRECTED, Disp: 60 tablet, Rfl: 0   lisinopril (ZESTRIL) 10 MG tablet, TAKE 1 TABLET BY MOUTH DAILY., Disp: 30 tablet, Rfl: 0   loratadine (CLARITIN) 10 MG tablet, TAKE 1 TABLET ONCE A DAY, Disp: 30 tablet, Rfl: 0   methylPREDNISolone (MEDROL) 4 MG TBPK tablet, Use as directed., Disp: 21 tablet, Rfl: 0   metoprolol tartrate (LOPRESSOR) 25 MG tablet, TAKE 1/2 TABLET BY MOUTH TWICE DAILY, Disp: 90 tablet, Rfl: 1   montelukast (SINGULAIR) 10 MG tablet, TAKE 1 TABLET BY MOUTH ONCE A DAY, Disp: 90 tablet, Rfl: 3   nitroGLYCERIN (NITROSTAT) 0.4 MG SL tablet, Place 0.4 mg under the tongue every 5 (five) minutes as needed for chest pain. If no relief after 3 doses call 911, Disp: , Rfl:    Olopatadine HCl (PATADAY) 0.2 % SOLN, Place 1 drop into both eyes 1 day or 1 dose., Disp: 2.5 mL, Rfl: 5   ondansetron (ZOFRAN) 4 MG tablet, Take 1 tablet (4 mg total) by mouth every 8 (eight) hours as needed for nausea or vomiting., Disp: 20 tablet, Rfl: 0   oxyCODONE ER (XTAMPZA ER) 9 MG C12A, Take 1 capsule by mouth in the morning and at bedtime., Disp: 60 capsule, Rfl: 0   potassium chloride (MICRO-K) 10 MEQ CR capsule, TAKE 1 CAPSULE BY MOUTH ONCE DAILY, Disp: 90 capsule, Rfl: 1   rosuvastatin (CRESTOR) 10 MG tablet, TAKE 1 TABLET ONCE A DAY, Disp: 90 tablet, Rfl: 0   Vitamin D, Ergocalciferol, (DRISDOL) 1.25 MG (50000 UNIT) CAPS capsule, Take 1 capsule (50,000 Units total) by mouth every 7 (seven) days., Disp: 12 capsule, Rfl: 2    Review of Systems Review of Systems  Constitutional:  Negative for chills and fever.  Eyes:  Positive for blurred vision. Negative for double vision.  Respiratory:  Negative for shortness of breath and wheezing.   Cardiovascular:  Positive for chest pain. Negative for palpitations and leg swelling.  Gastrointestinal:  Positive for heartburn. Negative for abdominal pain, constipation, diarrhea, nausea and vomiting.  Genitourinary:  Negative for frequency.  Musculoskeletal:  Negative for myalgias.  Skin:  Negative for itching and rash.  Neurological:  Negative for dizziness, weakness and headaches.  Endo/Heme/Allergies:  Negative for polydipsia.       Objective:    Vitals BP 134/88   Pulse 67   Temp 98.6 F (37 C)   Resp 17   Ht 5\' 4"  (1.626 m)   Wt 192 lb (87.1 kg)   LMP  (LMP Unknown)   SpO2 97%   BMI 32.96 kg/m    Physical Examination Physical Exam Constitutional:      Appearance: Normal appearance. She is not ill-appearing.  Cardiovascular:     Rate and Rhythm: Normal rate and regular rhythm.     Pulses: Normal pulses.     Heart sounds: No murmur heard.    No friction rub. No gallop.  Pulmonary:     Effort: Pulmonary effort is normal. No respiratory distress.     Breath sounds: No wheezing, rhonchi or rales.  Abdominal:     General: Bowel sounds are normal. There is no distension.     Palpations: Abdomen is soft.     Tenderness: There is no abdominal tenderness.  Musculoskeletal:     Right lower leg: No edema.     Left lower leg: No edema.  Skin:    General: Skin  is warm and dry.     Findings: No rash.  Neurological:     General: No focal deficit present.     Mental Status: She is alert and oriented to person, place, and time.  Psychiatric:        Mood and Affect: Mood normal.        Behavior: Behavior normal.        Assessment & Plan:   Essential hypertension Her BP is currently controlled.  We will continue her current  medications.  Gastroesophageal reflux disease She is currently on dexilant for her reflux.  We will add pepcid to be used at night.  Chronic pain syndrome She will continue on her Xtampza at this time.  Chest pain We did an EKG on her today and this showed NSR with some t-wave inversion in her anterior and inferior leads.  She is not having active chest pain at this time.  I want her to continue her crestor, ASA and metoprolol.  I am going to add imdur 30mg  daily and get her in to see cardiology as soon as possible.    Return in about 3 months (around 12/11/2023).   Crist Fat, MD

## 2023-09-12 NOTE — Assessment & Plan Note (Signed)
Her BP is currently controlled.  We will continue her current medications.

## 2023-09-12 NOTE — Assessment & Plan Note (Signed)
We did an EKG on her today and this showed NSR with some t-wave inversion in her anterior and inferior leads.  She is not having active chest pain at this time.  I want her to continue her crestor, ASA and metoprolol.  I am going to add imdur 30mg  daily and get her in to see cardiology as soon as possible.

## 2023-09-12 NOTE — Assessment & Plan Note (Signed)
She is currently on dexilant for her reflux.  We will add pepcid to be used at night.

## 2023-09-12 NOTE — Assessment & Plan Note (Signed)
She will continue on her Xtampza at this time.

## 2023-09-13 DIAGNOSIS — K219 Gastro-esophageal reflux disease without esophagitis: Secondary | ICD-10-CM | POA: Diagnosis not present

## 2023-09-13 DIAGNOSIS — G894 Chronic pain syndrome: Secondary | ICD-10-CM | POA: Diagnosis not present

## 2023-09-13 DIAGNOSIS — E119 Type 2 diabetes mellitus without complications: Secondary | ICD-10-CM | POA: Diagnosis not present

## 2023-09-13 DIAGNOSIS — K829 Disease of gallbladder, unspecified: Secondary | ICD-10-CM | POA: Diagnosis not present

## 2023-09-13 DIAGNOSIS — M1990 Unspecified osteoarthritis, unspecified site: Secondary | ICD-10-CM | POA: Diagnosis not present

## 2023-09-13 DIAGNOSIS — J45909 Unspecified asthma, uncomplicated: Secondary | ICD-10-CM | POA: Diagnosis not present

## 2023-09-13 DIAGNOSIS — I10 Essential (primary) hypertension: Secondary | ICD-10-CM | POA: Diagnosis not present

## 2023-09-20 DIAGNOSIS — M62572 Muscle wasting and atrophy, not elsewhere classified, left ankle and foot: Secondary | ICD-10-CM | POA: Diagnosis not present

## 2023-09-20 DIAGNOSIS — M62571 Muscle wasting and atrophy, not elsewhere classified, right ankle and foot: Secondary | ICD-10-CM | POA: Diagnosis not present

## 2023-09-23 DIAGNOSIS — M79675 Pain in left toe(s): Secondary | ICD-10-CM | POA: Diagnosis not present

## 2023-09-23 DIAGNOSIS — B351 Tinea unguium: Secondary | ICD-10-CM | POA: Diagnosis not present

## 2023-09-23 DIAGNOSIS — L84 Corns and callosities: Secondary | ICD-10-CM | POA: Diagnosis not present

## 2023-10-07 ENCOUNTER — Other Ambulatory Visit: Payer: Self-pay

## 2023-10-07 DIAGNOSIS — I10 Essential (primary) hypertension: Secondary | ICD-10-CM | POA: Insufficient documentation

## 2023-10-07 DIAGNOSIS — K829 Disease of gallbladder, unspecified: Secondary | ICD-10-CM | POA: Insufficient documentation

## 2023-10-07 DIAGNOSIS — M5134 Other intervertebral disc degeneration, thoracic region: Secondary | ICD-10-CM | POA: Insufficient documentation

## 2023-10-07 DIAGNOSIS — M1990 Unspecified osteoarthritis, unspecified site: Secondary | ICD-10-CM | POA: Insufficient documentation

## 2023-10-07 DIAGNOSIS — E119 Type 2 diabetes mellitus without complications: Secondary | ICD-10-CM | POA: Insufficient documentation

## 2023-10-07 DIAGNOSIS — J45909 Unspecified asthma, uncomplicated: Secondary | ICD-10-CM | POA: Insufficient documentation

## 2023-10-08 ENCOUNTER — Ambulatory Visit: Payer: 59 | Attending: Cardiology | Admitting: Cardiology

## 2023-10-08 ENCOUNTER — Encounter: Payer: Self-pay | Admitting: Cardiology

## 2023-10-08 VITALS — BP 136/82 | HR 69 | Ht 62.0 in | Wt 192.8 lb

## 2023-10-08 DIAGNOSIS — I209 Angina pectoris, unspecified: Secondary | ICD-10-CM

## 2023-10-08 DIAGNOSIS — E669 Obesity, unspecified: Secondary | ICD-10-CM

## 2023-10-08 DIAGNOSIS — I252 Old myocardial infarction: Secondary | ICD-10-CM | POA: Insufficient documentation

## 2023-10-08 DIAGNOSIS — E7800 Pure hypercholesterolemia, unspecified: Secondary | ICD-10-CM | POA: Diagnosis not present

## 2023-10-08 DIAGNOSIS — I10 Essential (primary) hypertension: Secondary | ICD-10-CM

## 2023-10-08 HISTORY — DX: Angina pectoris, unspecified: I20.9

## 2023-10-08 HISTORY — DX: Obesity, unspecified: E66.9

## 2023-10-08 HISTORY — DX: Old myocardial infarction: I25.2

## 2023-10-08 NOTE — Addendum Note (Signed)
 Addended by: Eleonore Chiquito on: 10/08/2023 09:39 AM   Modules accepted: Orders

## 2023-10-08 NOTE — Patient Instructions (Signed)
 Medication Instructions:  Your physician recommends that you continue on your current medications as directed. Please refer to the Current Medication list given to you today.   *If you need a refill on your cardiac medications before your next appointment, please call your pharmacy*   Lab Work: Your physician recommends that you have a BMET and CBC today in the office for your upcoming procedure.  If you have labs (blood work) drawn today and your tests are completely normal, you will receive your results only by: MyChart Message (if you have MyChart) OR A paper copy in the mail If you have any lab test that is abnormal or we need to change your treatment, we will call you to review the results.   Testing/Procedures:  [place] [facility] [place] [address] [place] [address] Dept: [phone] Loc: [phone]  Frances Newman  10/08/2023  You are scheduled for a Cardiac Catheterization on Monday, January 6 with Dr.  Elmira .  1. Please arrive at the [facility] (Main Entrance A) at [facility]: [address] [place], [place] [address] at 7:30 AM (This time is 2 hour(s) before your procedure to ensure your preparation).   Free valet parking service is available. You will check in at ADMITTING. The support person will be asked to wait in the waiting room.  It is OK to have someone drop you off and come back when you are ready to be discharged.    Special note: Every effort is made to have your procedure done on time. Please understand that emergencies sometimes delay scheduled procedures.  2. Diet: Do not eat solid foods after midnight.  The patient may have clear liquids until 5am upon the day of the procedure.  3. Labs: You had labs done today in the office today.  4. Medication instructions in preparation for your procedure:   Contrast Allergy: No  Stop taking, Lisinopril   (Zestril  or Prinivil ) Monday, January 6,2025  On the morning of your procedure, take your Aspirin  81 mg and any morning medicines NOT listed above.  You may use sips of water.  5. Plan to go home the same day, you will only stay overnight if medically necessary. 6. Bring a current list of your medications and current insurance cards. 7. You MUST have a responsible person to drive you home. 8. Someone MUST be with you the first 24 hours after you arrive home or your discharge will be delayed. 9. Please wear clothes that are easy to get on and off and wear slip-on shoes.  Thank you for allowing us  to care for you!   -- [place] [facility]    Follow-Up: At [facility], you and your health needs are our priority.  As part of our continuing mission to provide you with exceptional heart care, we have created designated Provider Care Teams.  These Care Teams include your primary Cardiologist (physician) and Advanced Practice Providers (APPs -  Physician Assistants and Nurse Practitioners) who all work together to provide you with the care you need, when you need it.  We recommend signing up for the patient portal called MyChart.  Sign up information is provided on this After Visit Summary.  MyChart is used to connect with patients for Virtual Visits (Telemedicine).  Patients are able to view lab/test results, encounter notes, upcoming appointments, etc.  Non-urgent messages can be sent to your provider as well.  To learn more about what you can do with MyChart, go to [email or web address].    Your next appointment:   9 month(s)  The format for your next appointment:   In Person  Provider:   Jennifer Crape, MD   Other Instructions  Coronary Angiogram With Stent Coronary angiogram with stent placement is a procedure to widen or open a narrow blood vessel of the heart (coronary artery). Arteries may become blocked by cholesterol buildup (plaques) in the lining  of the artery wall. When a coronary artery becomes partially blocked, blood flow to that area decreases. This may lead to chest pain or a heart attack (myocardial infarction). A stent is a small piece of metal that looks like mesh or spring. Stent placement may be done as treatment after a heart attack, or to prevent a heart attack if a blocked artery is found by a coronary angiogram. Let your health care provider know about: Any allergies you have, including allergies to medicines or contrast dye. All medicines you are taking, including vitamins, herbs, eye drops, creams, and over-the-counter medicines. Any problems you or family members have had with anesthetic medicines. Any blood disorders you have. Any surgeries you have had. Any medical conditions you have, including kidney problems or kidney failure. Whether you are pregnant or may be pregnant. Whether you are breastfeeding. What are the risks? Generally, this is a safe procedure. However, serious problems may occur, including: Damage to nearby structures or organs, such as the heart, blood vessels, or kidneys. A return of blockage. Bleeding, infection, or bruising at the insertion site. A collection of blood under the skin (hematoma) at the insertion site. A blood clot in another part of the body. Allergic reaction to medicines or dyes. Bleeding into the abdomen (retroperitoneal bleeding). Stroke (rare). Heart attack (rare). What happens before the procedure? Staying hydrated Follow instructions from your health care provider about hydration, which may include: Up to 2 hours before the procedure - you may continue to drink clear liquids, such as water, clear fruit juice, black coffee, and plain tea.    Eating and drinking restrictions Follow instructions from your health care provider about eating and drinking, which may include: 8 hours before the procedure - stop eating heavy meals or foods, such as meat, fried foods, or fatty  foods. 6 hours before the procedure - stop eating light meals or foods, such as toast or cereal. 2 hours before the procedure - stop drinking clear liquids. Medicines Ask your health care provider about: Changing or stopping your regular medicines. This is especially important if you are taking diabetes medicines or blood thinners. Taking medicines such as aspirin  and ibuprofen. These medicines can thin your blood. Do not take these medicines unless your health care provider tells you to take them. Generally, aspirin  is recommended before a thin tube, called a catheter, is passed through a blood vessel and inserted into the heart (cardiac catheterization). Taking over-the-counter medicines, vitamins, herbs, and supplements. General instructions Do not use any products that contain nicotine or tobacco for at least 4 weeks before the procedure. These products include cigarettes, e-cigarettes, and chewing tobacco. If you need help quitting, ask your health care provider. Plan to have someone take you home from the hospital or clinic. If you will be going home right after the procedure, plan to have someone with you for 24 hours. You may have tests and imaging procedures. Ask your health care provider: How your insertion site will be marked. Ask which artery will  be used for the procedure. What steps will be taken to help prevent infection. These may include: Removing hair at the insertion site. Washing skin with a germ-killing soap. Taking antibiotic medicine. What happens during the procedure? An IV will be inserted into one of your veins. Electrodes may be placed on your chest to monitor your heart rate during the procedure. You will be given one or more of the following: A medicine to help you relax (sedative). A medicine to numb the area (local anesthetic) for catheter insertion. A small incision will be made for catheter insertion. The catheter will be inserted into an artery using a  guide wire. The location may be in your groin, your wrist, or the fold of your arm (near your elbow). An X-ray procedure (fluoroscopy) will be used to help guide the catheter to the opening of the heart arteries. A dye will be injected into the catheter. X-rays will be taken. The dye helps to show where any narrowing or blockages are located in the arteries. Tell your health care provider if you have chest pain or trouble breathing. A tiny wire will be guided to the blocked spot, and a balloon will be inflated to make the artery wider. The stent will be expanded to crush the plaques into the wall of the vessel. The stent will hold the area open and improve the blood flow. Most stents have a drug coating to reduce the risk of the stent narrowing over time. The artery may be made wider using a drill, laser, or other tools that remove plaques. The catheter will be removed when the blood flow improves. The stent will stay where it was placed, and the lining of the artery will grow over it. A bandage (dressing) will be placed on the insertion site. Pressure will be applied to stop bleeding. The IV will be removed. This procedure may vary among health care providers and hospitals.    What happens after the procedure? Your blood pressure, heart rate, breathing rate, and blood oxygen level will be monitored until you leave the hospital or clinic. If the procedure is done through the leg, you will lie flat in bed for a few hours or for as long as told by your health care provider. You will be instructed not to bend or cross your legs. The insertion site and the pulse in your foot or wrist will be checked often. You may have more blood tests, X-rays, and a test that records the electrical activity of your heart (electrocardiogram, or ECG). Do not drive for 24 hours if you were given a sedative during your procedure. Summary Coronary angiogram with stent placement is a procedure to widen or open a narrowed  coronary artery. This is done to treat heart problems. Before the procedure, let your health care provider know about all the medical conditions and surgeries you have or have had. This is a safe procedure. However, some problems may occur, including damage to nearby structures or organs, bleeding, blood clots, or allergies. Follow your health care provider's instructions about eating, drinking, medicines, and other lifestyle changes, such as quitting tobacco use before the procedure. This information is not intended to replace advice given to you by your health care provider. Make sure you discuss any questions you have with your health care provider. Document Revised: 04/15/2019 Document Reviewed: 04/15/2019 Elsevier Patient Education  2021 [employer]   Follow these instructions at home Medicines Take over-the-counter and prescription medicines only as told by your health  care provider. If you are taking blood thinners: Talk with your health care provider before you take any medicines that contain aspirin  or NSAIDs, such as ibuprofen. These medicines increase your risk for dangerous bleeding. Take your medicine exactly as told, at the same time every day. Avoid activities that could cause injury or bruising, and follow instructions about how to prevent falls. Wear a medical alert bracelet or carry a card that lists what medicines you take. General instructions Do not drink alcohol if: Your health care provider tells you not to drink. You are pregnant, may be pregnant, or are planning to become pregnant. If you drink alcohol: Limit how much you use to: 0-1 drink a day for women. 0-2 drinks a day for men. Be aware of how much alcohol is in your drink. In the [place] one drink equals one 12 oz bottle of beer (355 mL), one 5 oz glass of wine (148 mL), or one 1 oz glass of hard liquor (44 mL). Keep all follow-up visits as told by your health care provider. This is important. Where to find  more information The American Heart Association: www.heart.org Contact a health care provider if you have: Unusual bleeding or bruising. Stomach pain or nausea. Ringing in your ears. An allergic reaction that causes hives, itchy skin, or swelling of the lips, tongue, or face. Get help right away if: You notice that your bowel movements are bloody, or dark red or black in color. You vomit or cough up blood. You have blood in your urine. You cough, breathe loudly (wheeze), or feel short of breath. You have chest pain, especially if the pain spreads to your arms, back, neck, or jaw. You have a headache with confusion. You have any symptoms of a stroke. BE FAST is an easy way to remember the main warning signs of a stroke: B - Balance. Signs are dizziness, sudden trouble walking, or loss of balance. E - Eyes. Signs are trouble seeing or a sudden change in vision. F - Face. Signs are sudden weakness or numbness of the face, or the face or eyelid drooping on one side. A - Arms. Signs are weakness or numbness in an arm. This happens suddenly and usually on one side of the body. S - Speech. Signs are sudden trouble speaking, slurred speech, or trouble understanding what people say. T - Time. Time to call emergency services. Write down what time symptoms started. You have other signs of a stroke, such as: A sudden, severe headache with no known cause. Nausea or vomiting. Seizure. These symptoms may represent a serious problem that is an emergency. Do not wait to see if the symptoms will go away. Get medical help right away. Call your local emergency services (911 in the [place] Do not drive yourself to the hospital. Summary Aspirin  use can help reduce the risk of blood clots, heart attacks, and other heart-related problems. Daily use of aspirin  can cause side effects. Take aspirin  only as told by your health care provider. Make sure that you understand how much to take and what form to  take. Your health care provider will help you determine whether it is safe and beneficial for you to take aspirin  daily. This information is not intended to replace advice given to you by your health care provider. Make sure you discuss any questions you have with your health care provider. Document Revised: 06/29/2019 Document Reviewed: 06/29/2019 Elsevier Patient Education  2021 [employer].
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
## 2023-10-09 LAB — BASIC METABOLIC PANEL
BUN/Creatinine Ratio: 20 (ref 12–28)
BUN: 21 mg/dL (ref 8–27)
CO2: 20 mmol/L (ref 20–29)
Calcium: 9.5 mg/dL (ref 8.7–10.3)
Chloride: 108 mmol/L — ABNORMAL HIGH (ref 96–106)
Creatinine, Ser: 1.07 mg/dL — ABNORMAL HIGH (ref 0.57–1.00)
Glucose: 103 mg/dL — ABNORMAL HIGH (ref 70–99)
Potassium: 4.5 mmol/L (ref 3.5–5.2)
Sodium: 143 mmol/L (ref 134–144)
eGFR: 54 mL/min/{1.73_m2} — ABNORMAL LOW (ref >59)

## 2023-10-09 LAB — CBC WITH DIFFERENTIAL/PLATELET
Basophils Absolute: 0.1 10*3/uL (ref 0.0–0.2)
Basos: 1 %
EOS (ABSOLUTE): 0.2 10*3/uL (ref 0.0–0.4)
Eos: 2 %
Hematocrit: 35.4 % (ref 34.0–46.6)
Hemoglobin: 11.2 g/dL (ref 11.1–15.9)
Immature Grans (Abs): 0 10*3/uL (ref 0.0–0.1)
Immature Granulocytes: 0 %
Lymphocytes Absolute: 1.6 10*3/uL (ref 0.7–3.1)
Lymphs: 19 %
MCH: 28.8 pg (ref 26.6–33.0)
MCHC: 31.6 g/dL (ref 31.5–35.7)
MCV: 91 fL (ref 79–97)
Monocytes Absolute: 0.6 10*3/uL (ref 0.1–0.9)
Monocytes: 7 %
Neutrophils Absolute: 6.1 10*3/uL (ref 1.4–7.0)
Neutrophils: 71 %
Platelets: 226 10*3/uL (ref 150–450)
RBC: 3.89 x10E6/uL (ref 3.77–5.28)
RDW: 13.3 % (ref 11.7–15.4)
WBC: 8.4 10*3/uL (ref 3.4–10.8)

## 2023-10-10 ENCOUNTER — Other Ambulatory Visit: Payer: Self-pay | Admitting: Internal Medicine

## 2023-10-11 ENCOUNTER — Other Ambulatory Visit: Payer: Self-pay

## 2023-10-11 ENCOUNTER — Telehealth: Payer: Self-pay

## 2023-10-11 DIAGNOSIS — F411 Generalized anxiety disorder: Secondary | ICD-10-CM

## 2023-10-11 MED ORDER — HYDROXYZINE HCL 10 MG PO TABS: 10.0000 mg | ORAL_TABLET | Freq: Three times a day (TID) | ORAL | 1 refills | Status: DC | PRN

## 2023-10-11 NOTE — Telephone Encounter (Signed)
 Left vm to return call.

## 2023-10-11 NOTE — Progress Notes (Signed)
 Rx refill

## 2023-10-11 NOTE — Telephone Encounter (Signed)
-----   Message from Jennifer JONELLE Crape sent at 10/10/2023 12:07 PM EST ----- The results of the study is unremarkable. Please inform patient. I will discuss in detail at next appointment. Cc  primary care/referring physician Jennifer JONELLE Crape, MD 10/10/2023 12:07 PM

## 2023-10-12 ENCOUNTER — Other Ambulatory Visit: Payer: Self-pay | Admitting: Internal Medicine

## 2023-10-13 ENCOUNTER — Telehealth: Payer: Self-pay | Admitting: Internal Medicine
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
## 2023-10-14 ENCOUNTER — Ambulatory Visit (HOSPITAL_COMMUNITY): Admit: 2023-10-14 | Payer: 59 | Admitting: Cardiology

## 2023-10-14 ENCOUNTER — Encounter (HOSPITAL_COMMUNITY): Payer: Self-pay

## 2023-10-14 SURGERY — LEFT HEART CATH AND CORONARY ANGIOGRAPHY
Anesthesia: LOCAL

## 2023-10-15 ENCOUNTER — Other Ambulatory Visit: Payer: Self-pay | Admitting: Internal Medicine

## 2023-10-15 MED ORDER — XTAMPZA ER 9 MG PO C12A: 1.0000 | EXTENDED_RELEASE_CAPSULE | Freq: Two times a day (BID) | ORAL | 0 refills | Status: DC

## 2023-10-15 NOTE — Telephone Encounter (Signed)
 Patient informed of results.

## 2023-10-15 NOTE — Telephone Encounter (Signed)
 Patient is returning call.

## 2023-10-18 ENCOUNTER — Telehealth: Payer: Self-pay

## 2023-10-18 NOTE — Telephone Encounter (Signed)
 Called patient to inform her of her lab results and she stated that she had to cancel her cardiac cath that was scheduled on 10/14/23 and she was ready to reschedule her cardiac cath. Called the cath lab and rescheduled her cardiac cath for 10/24/23 at 11:00 am with Dr. Elmira. Attempted to call the patient but she did not answer the phone  and her voice mail was not set-up so I was not able to leave a message.

## 2023-10-21 ENCOUNTER — Other Ambulatory Visit: Payer: Self-pay | Admitting: Internal Medicine

## 2023-10-21 DIAGNOSIS — M62572 Muscle wasting and atrophy, not elsewhere classified, left ankle and foot: Secondary | ICD-10-CM | POA: Diagnosis not present

## 2023-10-21 DIAGNOSIS — M62571 Muscle wasting and atrophy, not elsewhere classified, right ankle and foot: Secondary | ICD-10-CM | POA: Diagnosis not present

## 2023-10-22 ENCOUNTER — Telehealth: Payer: Self-pay | Admitting: *Deleted

## 2023-10-22 NOTE — Telephone Encounter (Signed)
 Cardiac Catheterization scheduled at [facility] for: Thursday October 24, 2023 11 AM Arrival time [facility] Main Entrance A at: 9 AM  Nothing to eat after midnight prior to procedure, clear liquids until 5 AM day of procedure.  Medication instructions: -Hold:  Lisinopril -day before and day of procedure-per protocol GFR < 60 (54) -Other usual morning medications can be taken with sips of water including aspirin  81 mg.  Plan to go home the same day, you will only stay overnight if medically necessary.  You must have responsible adult to drive you home.  Someone must be with you the first 24 hours after you arrive home.  Reviewed procedure instructions with patient.

## 2023-10-23 ENCOUNTER — Other Ambulatory Visit: Payer: Self-pay | Admitting: Internal Medicine

## 2023-10-23 MED ORDER — ALPRAZOLAM 0.25 MG PO TABS: 0.2500 mg | ORAL_TABLET | Freq: Every day | ORAL | 2 refills | Status: DC | PRN

## 2023-10-24 ENCOUNTER — Encounter (HOSPITAL_COMMUNITY): Payer: Self-pay | Admitting: Cardiology

## 2023-10-24 ENCOUNTER — Other Ambulatory Visit: Payer: Self-pay

## 2023-10-24 ENCOUNTER — Encounter (HOSPITAL_COMMUNITY)
Admission: RE | Disposition: A | Discharge status: Home / Self Care | Payer: Self-pay | Source: Ambulatory Visit | Attending: Cardiology

## 2023-10-24 ENCOUNTER — Ambulatory Visit (HOSPITAL_COMMUNITY)
Admission: RE | Admit: 2023-10-24 | Discharge: 2023-10-25 | Disposition: A | Discharge status: Home / Self Care | Payer: 59 | Source: Ambulatory Visit | Attending: Cardiology | Admitting: Cardiology

## 2023-10-24 DIAGNOSIS — Z6831 Body mass index (BMI) 31.0-31.9, adult: Secondary | ICD-10-CM | POA: Insufficient documentation

## 2023-10-24 DIAGNOSIS — Z87891 Personal history of nicotine dependence: Secondary | ICD-10-CM | POA: Insufficient documentation

## 2023-10-24 DIAGNOSIS — I10 Essential (primary) hypertension: Secondary | ICD-10-CM | POA: Diagnosis present

## 2023-10-24 DIAGNOSIS — Z7902 Long term (current) use of antithrombotics/antiplatelets: Secondary | ICD-10-CM | POA: Insufficient documentation

## 2023-10-24 DIAGNOSIS — Z8249 Family history of ischemic heart disease and other diseases of the circulatory system: Secondary | ICD-10-CM | POA: Insufficient documentation

## 2023-10-24 DIAGNOSIS — E669 Obesity, unspecified: Secondary | ICD-10-CM | POA: Diagnosis not present

## 2023-10-24 DIAGNOSIS — Z79899 Other long term (current) drug therapy: Secondary | ICD-10-CM | POA: Insufficient documentation

## 2023-10-24 DIAGNOSIS — I2584 Coronary atherosclerosis due to calcified coronary lesion: Secondary | ICD-10-CM | POA: Insufficient documentation

## 2023-10-24 DIAGNOSIS — I252 Old myocardial infarction: Secondary | ICD-10-CM | POA: Insufficient documentation

## 2023-10-24 DIAGNOSIS — I25118 Atherosclerotic heart disease of native coronary artery with other forms of angina pectoris: Secondary | ICD-10-CM | POA: Insufficient documentation

## 2023-10-24 DIAGNOSIS — J4530 Mild persistent asthma, uncomplicated: Secondary | ICD-10-CM | POA: Insufficient documentation

## 2023-10-24 DIAGNOSIS — Z955 Presence of coronary angioplasty implant and graft: Secondary | ICD-10-CM

## 2023-10-24 DIAGNOSIS — Z9861 Coronary angioplasty status: Secondary | ICD-10-CM

## 2023-10-24 DIAGNOSIS — I209 Angina pectoris, unspecified: Secondary | ICD-10-CM

## 2023-10-24 DIAGNOSIS — E782 Mixed hyperlipidemia: Secondary | ICD-10-CM | POA: Diagnosis not present

## 2023-10-24 DIAGNOSIS — I2510 Atherosclerotic heart disease of native coronary artery without angina pectoris: Secondary | ICD-10-CM | POA: Insufficient documentation

## 2023-10-24 DIAGNOSIS — E7800 Pure hypercholesterolemia, unspecified: Secondary | ICD-10-CM | POA: Diagnosis present

## 2023-10-24 HISTORY — PX: LEFT HEART CATH AND CORONARY ANGIOGRAPHY: CATH118249

## 2023-10-24 HISTORY — DX: Coronary angioplasty status: Z98.61

## 2023-10-24 HISTORY — PX: CORONARY STENT INTERVENTION: CATH118234

## 2023-10-24 LAB — CBC
HCT: 26.2 % — ABNORMAL LOW (ref 36.0–46.0)
Hemoglobin: 8.3 g/dL — ABNORMAL LOW (ref 12.0–15.0)
MCH: 29 pg (ref 26.0–34.0)
MCHC: 31.7 g/dL (ref 30.0–36.0)
MCV: 91.6 fL (ref 80.0–100.0)
Platelets: 142 10*3/uL — ABNORMAL LOW (ref 150–400)
RBC: 2.86 MIL/uL — ABNORMAL LOW (ref 3.87–5.11)
RDW: 14.2 % (ref 11.5–15.5)
WBC: 7.8 10*3/uL (ref 4.0–10.5)
nRBC: 0 % (ref 0.0–0.2)

## 2023-10-24 LAB — GLUCOSE, CAPILLARY: Glucose-Capillary: 95 mg/dL (ref 70–99)

## 2023-10-24 LAB — POCT ACTIVATED CLOTTING TIME
Activated Clotting Time: 268 s
Activated Clotting Time: 395 s
Activated Clotting Time: 297 s

## 2023-10-24 LAB — CREATININE, SERUM
Creatinine, Ser: 0.5 mg/dL (ref 0.44–1.00)
GFR, Estimated: >60 mL/min (ref >60)

## 2023-10-24 SURGERY — LEFT HEART CATH AND CORONARY ANGIOGRAPHY
Anesthesia: LOCAL

## 2023-10-24 MED ORDER — OXYCODONE HCL ER 10 MG PO T12A
10.0000 mg | EXTENDED_RELEASE_TABLET | Freq: Two times a day (BID) | ORAL | Status: DC
  Administered 2023-10-24 – 2023-10-25 (×2): 10 mg via ORAL
  Filled 2023-10-24 (×2): qty 1

## 2023-10-24 MED ORDER — SODIUM CHLORIDE 0.9 % WEIGHT BASED INFUSION
3.0000 mL/kg/h | INTRAVENOUS | Status: AC
  Administered 2023-10-24: 3 mL/kg/h via INTRAVENOUS

## 2023-10-24 MED ORDER — LORATADINE 10 MG PO TABS
10.0000 mg | ORAL_TABLET | Freq: Every day | ORAL | Status: DC
  Administered 2023-10-25: 10 mg via ORAL
  Filled 2023-10-24: qty 1

## 2023-10-24 MED ORDER — HEPARIN SODIUM (PORCINE) 1000 UNIT/ML IJ SOLN
INTRAMUSCULAR | Status: DC | PRN
  Administered 2023-10-24: 5000 [IU] via INTRAVENOUS
  Administered 2023-10-24: 2000 [IU] via INTRAVENOUS
  Administered 2023-10-24: 3000 [IU] via INTRAVENOUS

## 2023-10-24 MED ORDER — SODIUM CHLORIDE 0.9 % IV SOLN: INTRAVENOUS | Status: AC

## 2023-10-24 MED ORDER — ONDANSETRON HCL 4 MG/2ML IJ SOLN: 4.0000 mg | Freq: Four times a day (QID) | INTRAMUSCULAR | Status: DC | PRN

## 2023-10-24 MED ORDER — LABETALOL HCL 5 MG/ML IV SOLN
INTRAVENOUS | Status: DC | PRN
  Administered 2023-10-24: 10 mg via INTRAVENOUS

## 2023-10-24 MED ORDER — VERAPAMIL HCL 2.5 MG/ML IV SOLN
INTRAVENOUS | Status: DC | PRN
  Administered 2023-10-24: 10 mL via INTRA_ARTERIAL

## 2023-10-24 MED ORDER — LIDOCAINE HCL (PF) 1 % IJ SOLN
INTRAMUSCULAR | Status: AC
  Filled 2023-10-24: qty 30

## 2023-10-24 MED ORDER — HEPARIN SODIUM (PORCINE) 1000 UNIT/ML IJ SOLN
INTRAMUSCULAR | Status: AC
  Filled 2023-10-24: qty 10

## 2023-10-24 MED ORDER — LABETALOL HCL 5 MG/ML IV SOLN: 10.0000 mg | INTRAVENOUS | Status: AC | PRN

## 2023-10-24 MED ORDER — IOHEXOL 350 MG/ML SOLN
INTRAVENOUS | Status: DC | PRN
  Administered 2023-10-24: 240 mL

## 2023-10-24 MED ORDER — GABAPENTIN 300 MG PO CAPS
300.0000 mg | ORAL_CAPSULE | Freq: Every day | ORAL | Status: DC
  Administered 2023-10-24: 300 mg via ORAL
  Filled 2023-10-24: qty 1

## 2023-10-24 MED ORDER — SODIUM CHLORIDE 0.9% FLUSH
3.0000 mL | Freq: Two times a day (BID) | INTRAVENOUS | Status: DC
  Administered 2023-10-24 – 2023-10-25 (×2): 3 mL via INTRAVENOUS

## 2023-10-24 MED ORDER — DONEPEZIL HCL 5 MG PO TABS
5.0000 mg | ORAL_TABLET | Freq: Every day | ORAL | Status: DC
Start: 2023-10-25 — End: 2023-10-25

## 2023-10-24 MED ORDER — FLUTICASONE FUROATE-VILANTEROL 200-25 MCG/ACT IN AEPB
1.0000 | INHALATION_SPRAY | Freq: Every day | RESPIRATORY_TRACT | Status: DC
  Administered 2023-10-25: 1 via RESPIRATORY_TRACT
  Filled 2023-10-24: qty 28

## 2023-10-24 MED ORDER — LABETALOL HCL 5 MG/ML IV SOLN
INTRAVENOUS | Status: AC
  Filled 2023-10-24: qty 4

## 2023-10-24 MED ORDER — SODIUM CHLORIDE 0.9 % WEIGHT BASED INFUSION: 1.0000 mL/kg/h | INTRAVENOUS | Status: DC

## 2023-10-24 MED ORDER — SODIUM CHLORIDE 0.9 % IV SOLN
250.0000 mL | INTRAVENOUS | Status: DC | PRN
Start: 2023-10-24 — End: 2023-10-25

## 2023-10-24 MED ORDER — FLUTICASONE PROPIONATE 50 MCG/ACT NA SUSP
1.0000 | Freq: Every day | NASAL | Status: DC
  Filled 2023-10-24: qty 16

## 2023-10-24 MED ORDER — ACETAMINOPHEN 325 MG PO TABS
ORAL_TABLET | ORAL | Status: AC
  Administered 2023-10-24: 650 mg via ORAL
  Filled 2023-10-24: qty 2

## 2023-10-24 MED ORDER — OLOPATADINE HCL 0.1 % OP SOLN
1.0000 [drp] | Freq: Two times a day (BID) | OPHTHALMIC | Status: DC
Start: 2023-10-24 — End: 2023-10-25
  Administered 2023-10-24 – 2023-10-25 (×2): 1 [drp] via OPHTHALMIC
  Filled 2023-10-24: qty 5

## 2023-10-24 MED ORDER — VERAPAMIL HCL 2.5 MG/ML IV SOLN
INTRAVENOUS | Status: AC
  Filled 2023-10-24: qty 2

## 2023-10-24 MED ORDER — ACETAMINOPHEN 325 MG PO TABS
650.0000 mg | ORAL_TABLET | ORAL | Status: DC | PRN
  Administered 2023-10-24: 650 mg via ORAL
  Filled 2023-10-24: qty 2

## 2023-10-24 MED ORDER — ASPIRIN 81 MG PO CHEW: 81.0000 mg | CHEWABLE_TABLET | ORAL | Status: DC

## 2023-10-24 MED ORDER — ROSUVASTATIN CALCIUM 5 MG PO TABS
10.0000 mg | ORAL_TABLET | Freq: Every day | ORAL | Status: DC
  Administered 2023-10-24: 10 mg via ORAL
  Filled 2023-10-24: qty 2

## 2023-10-24 MED ORDER — ESCITALOPRAM OXALATE 10 MG PO TABS
30.0000 mg | ORAL_TABLET | Freq: Every day | ORAL | Status: DC
  Administered 2023-10-25: 30 mg via ORAL
  Filled 2023-10-24: qty 1

## 2023-10-24 MED ORDER — CYCLOBENZAPRINE HCL 5 MG PO TABS: 5.0000 mg | ORAL_TABLET | Freq: Three times a day (TID) | ORAL | Status: DC | PRN

## 2023-10-24 MED ORDER — SODIUM CHLORIDE 0.9% FLUSH: 3.0000 mL | INTRAVENOUS | Status: DC | PRN

## 2023-10-24 MED ORDER — FENTANYL CITRATE (PF) 100 MCG/2ML IJ SOLN
INTRAMUSCULAR | Status: AC
  Filled 2023-10-24: qty 2

## 2023-10-24 MED ORDER — CLOPIDOGREL BISULFATE 75 MG PO TABS
75.0000 mg | ORAL_TABLET | Freq: Every day | ORAL | Status: DC
  Administered 2023-10-25: 75 mg via ORAL
  Filled 2023-10-24: qty 1

## 2023-10-24 MED ORDER — IPRATROPIUM-ALBUTEROL 0.5-2.5 (3) MG/3ML IN SOLN: 3.0000 mL | RESPIRATORY_TRACT | Status: DC | PRN

## 2023-10-24 MED ORDER — HYDRALAZINE HCL 20 MG/ML IJ SOLN: 10.0000 mg | INTRAMUSCULAR | Status: AC | PRN

## 2023-10-24 MED ORDER — CLOPIDOGREL BISULFATE 300 MG PO TABS
ORAL_TABLET | ORAL | Status: DC | PRN
  Administered 2023-10-24: 600 mg via ORAL

## 2023-10-24 MED ORDER — LABETALOL HCL 5 MG/ML IV SOLN
10.0000 mg | INTRAVENOUS | Status: DC | PRN
  Administered 2023-10-24 – 2023-10-25 (×2): 10 mg via INTRAVENOUS
  Filled 2023-10-24 (×2): qty 4

## 2023-10-24 MED ORDER — METOPROLOL TARTRATE 12.5 MG HALF TABLET
12.5000 mg | ORAL_TABLET | Freq: Two times a day (BID) | ORAL | Status: DC
Start: 2023-10-24 — End: 2023-10-25
  Administered 2023-10-24: 12.5 mg via ORAL
  Filled 2023-10-24 (×2): qty 1

## 2023-10-24 MED ORDER — ASPIRIN 81 MG PO CHEW
81.0000 mg | CHEWABLE_TABLET | Freq: Two times a day (BID) | ORAL | Status: DC
  Administered 2023-10-24 – 2023-10-25 (×2): 81 mg via ORAL
  Filled 2023-10-24 (×2): qty 1

## 2023-10-24 MED ORDER — MIDAZOLAM HCL 2 MG/2ML IJ SOLN
INTRAMUSCULAR | Status: AC
Start: 2023-10-24 — End: ?
  Filled 2023-10-24: qty 2

## 2023-10-24 MED ORDER — FAMOTIDINE 20 MG PO TABS
40.0000 mg | ORAL_TABLET | Freq: Every evening | ORAL | Status: DC
  Administered 2023-10-24: 40 mg via ORAL
  Filled 2023-10-24: qty 2

## 2023-10-24 MED ORDER — FENTANYL CITRATE (PF) 100 MCG/2ML IJ SOLN
INTRAMUSCULAR | Status: DC | PRN
  Administered 2023-10-24 (×3): 25 ug via INTRAVENOUS

## 2023-10-24 MED ORDER — NITROGLYCERIN 0.4 MG SL SUBL: 0.4000 mg | SUBLINGUAL_TABLET | SUBLINGUAL | Status: DC | PRN

## 2023-10-24 MED ORDER — NITROGLYCERIN 1 MG/10 ML FOR IR/CATH LAB
INTRA_ARTERIAL | Status: AC
  Filled 2023-10-24: qty 10

## 2023-10-24 MED ORDER — ISOSORBIDE MONONITRATE ER 30 MG PO TB24: 30.0000 mg | ORAL_TABLET | Freq: Every day | ORAL | Status: DC

## 2023-10-24 MED ORDER — MIDAZOLAM HCL 2 MG/2ML IJ SOLN
INTRAMUSCULAR | Status: AC
  Filled 2023-10-24: qty 2

## 2023-10-24 MED ORDER — HEPARIN (PORCINE) IN NACL 1000-0.9 UT/500ML-% IV SOLN
INTRAVENOUS | Status: DC | PRN
  Administered 2023-10-24 (×3): 500 mL

## 2023-10-24 MED ORDER — HYDROXYZINE HCL 10 MG PO TABS: 10.0000 mg | ORAL_TABLET | Freq: Three times a day (TID) | ORAL | Status: DC | PRN

## 2023-10-24 MED ORDER — MIDAZOLAM HCL 2 MG/2ML IJ SOLN
INTRAMUSCULAR | Status: DC | PRN
  Administered 2023-10-24 (×3): 1 mg via INTRAVENOUS

## 2023-10-24 MED ORDER — VITAMIN D (ERGOCALCIFEROL) 1.25 MG (50000 UNIT) PO CAPS: 50000.0000 [IU] | ORAL_CAPSULE | ORAL | Status: DC

## 2023-10-24 MED ORDER — FERROUS SULFATE 325 (65 FE) MG PO TABS
325.0000 mg | ORAL_TABLET | Freq: Every day | ORAL | Status: DC
  Administered 2023-10-24 – 2023-10-25 (×2): 325 mg via ORAL
  Filled 2023-10-24 (×2): qty 1

## 2023-10-24 MED ORDER — HEPARIN SODIUM (PORCINE) 5000 UNIT/ML IJ SOLN
5000.0000 [IU] | Freq: Three times a day (TID) | INTRAMUSCULAR | Status: DC
  Administered 2023-10-24 – 2023-10-25 (×2): 5000 [IU] via SUBCUTANEOUS
  Filled 2023-10-24 (×2): qty 1

## 2023-10-24 MED ORDER — PANTOPRAZOLE SODIUM 40 MG PO TBEC
40.0000 mg | DELAYED_RELEASE_TABLET | Freq: Every day | ORAL | Status: DC
  Administered 2023-10-25: 40 mg via ORAL
  Filled 2023-10-24: qty 1

## 2023-10-24 MED ORDER — OXYCODONE-ACETAMINOPHEN 5-325 MG PO TABS
1.0000 | ORAL_TABLET | Freq: Once | ORAL | Status: AC
  Administered 2023-10-24: 1 via ORAL
  Filled 2023-10-24: qty 1

## 2023-10-24 MED ORDER — LIDOCAINE HCL (PF) 1 % IJ SOLN
INTRAMUSCULAR | Status: DC | PRN
  Administered 2023-10-24: 2 mL via INTRADERMAL

## 2023-10-24 MED ORDER — MONTELUKAST SODIUM 10 MG PO TABS
10.0000 mg | ORAL_TABLET | Freq: Every day | ORAL | Status: DC
  Administered 2023-10-24 – 2023-10-25 (×2): 10 mg via ORAL
  Filled 2023-10-24 (×2): qty 1

## 2023-10-24 MED ORDER — ONDANSETRON HCL 4 MG PO TABS: 4.0000 mg | ORAL_TABLET | Freq: Three times a day (TID) | ORAL | Status: DC | PRN

## 2023-10-24 SURGICAL SUPPLY — 32 items
BALL SAPPHIRE NC24 3.0X10 (BALLOONS) ×1
BALLN EMERGE MR 3.0X15 (BALLOONS) ×1
BALLN SAPPHIRE 2.5X12 (BALLOONS) ×1
BALLN SAPPHIRE 2.75X10 (BALLOONS) ×1
BALLOON EMERGE MR 3.0X15 (BALLOONS) IMPLANT
BALLOON SAPPHIRE 2.5X12 (BALLOONS) IMPLANT
BALLOON SAPPHIRE 2.75X10 (BALLOONS) IMPLANT
BALLOON SAPPHIRE NC24 3.0X10 (BALLOONS) IMPLANT
BALLOON TAKERU 1.5X12 (BALLOONS) IMPLANT
BALLOON TAKERU 2.0X12 (BALLOONS) IMPLANT
CATH GUIDELINER COAST (CATHETERS) IMPLANT
CATH INFINITI AMBI 5FR TG (CATHETERS) IMPLANT
CATH INFINITI JR4 5F (CATHETERS) IMPLANT
CATH LAUNCHER 6FR EBU 3 (CATHETERS) IMPLANT
CATH LAUNCHER 6FR JR4 (CATHETERS) IMPLANT
CATH OPTICROSS HD (CATHETERS) IMPLANT
CATH VISTA GUIDE 6FR AR1 (CATHETERS) ×1
CATH VISTA GUIDE 6FR AR1 MULPK (CATHETERS) IMPLANT
DEVICE RAD COMP TR BAND LRG (VASCULAR PRODUCTS) IMPLANT
GLIDESHEATH SLEND SS 6F .021 (SHEATH) IMPLANT
GUIDEWIRE INQWIRE 1.5J.035X260 (WIRE) IMPLANT
INQWIRE 1.5J .035X260CM (WIRE) ×1
KIT ENCORE 26 ADVANTAGE (KITS) IMPLANT
KIT ESSENTIALS PG (KITS) IMPLANT
KIT HEMO VALVE WATCHDOG (MISCELLANEOUS) IMPLANT
PACK CARDIAC CATHETERIZATION (CUSTOM PROCEDURE TRAY) ×1 IMPLANT
SET ATX-X65L (MISCELLANEOUS) IMPLANT
SHEATH PROBE COVER 6X72 (BAG) IMPLANT
SLED PULL BACK IVUS (MISCELLANEOUS) IMPLANT
STENT SYNERGY XD 2.75X12 (Permanent Stent) IMPLANT
SYNERGY XD 2.75X12 (Permanent Stent) ×1 IMPLANT
WIRE RUNTHROUGH .014X180CM (WIRE) IMPLANT
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
## 2023-10-24 NOTE — Interval H&P Note (Signed)
History and Physical Interval Note:  10/24/2023 12:36 PM  Frances Newman  has presented today for surgery, with the diagnosis of angina.  The various methods of treatment have been discussed with the patient and family. After consideration of risks, benefits and other options for treatment, the patient has consented to  Procedure(s): LEFT HEART CATH AND CORONARY ANGIOGRAPHY (N/A) as a surgical intervention.  The patient's history has been reviewed, patient examined, no change in status, stable for surgery.  I have reviewed the patient's chart and labs.  Questions were answered to the patient's satisfaction.     Angelli Baruch J Alexandrea Westergard

## 2023-10-25 ENCOUNTER — Other Ambulatory Visit (HOSPITAL_COMMUNITY): Payer: Self-pay

## 2023-10-25 ENCOUNTER — Encounter (HOSPITAL_COMMUNITY): Payer: Self-pay | Admitting: Cardiology

## 2023-10-25 DIAGNOSIS — I2510 Atherosclerotic heart disease of native coronary artery without angina pectoris: Secondary | ICD-10-CM

## 2023-10-25 DIAGNOSIS — E782 Mixed hyperlipidemia: Secondary | ICD-10-CM | POA: Diagnosis not present

## 2023-10-25 DIAGNOSIS — I25118 Atherosclerotic heart disease of native coronary artery with other forms of angina pectoris: Secondary | ICD-10-CM | POA: Insufficient documentation

## 2023-10-25 DIAGNOSIS — Z955 Presence of coronary angioplasty implant and graft: Secondary | ICD-10-CM

## 2023-10-25 DIAGNOSIS — Z8249 Family history of ischemic heart disease and other diseases of the circulatory system: Secondary | ICD-10-CM | POA: Diagnosis not present

## 2023-10-25 DIAGNOSIS — I209 Angina pectoris, unspecified: Secondary | ICD-10-CM

## 2023-10-25 DIAGNOSIS — Z7902 Long term (current) use of antithrombotics/antiplatelets: Secondary | ICD-10-CM | POA: Diagnosis not present

## 2023-10-25 DIAGNOSIS — I2584 Coronary atherosclerosis due to calcified coronary lesion: Secondary | ICD-10-CM | POA: Diagnosis not present

## 2023-10-25 DIAGNOSIS — Z9861 Coronary angioplasty status: Secondary | ICD-10-CM | POA: Diagnosis not present

## 2023-10-25 DIAGNOSIS — Z87891 Personal history of nicotine dependence: Secondary | ICD-10-CM | POA: Diagnosis not present

## 2023-10-25 DIAGNOSIS — I252 Old myocardial infarction: Secondary | ICD-10-CM | POA: Diagnosis not present

## 2023-10-25 DIAGNOSIS — I10 Essential (primary) hypertension: Secondary | ICD-10-CM | POA: Diagnosis not present

## 2023-10-25 DIAGNOSIS — E669 Obesity, unspecified: Secondary | ICD-10-CM | POA: Diagnosis not present

## 2023-10-25 DIAGNOSIS — Z79899 Other long term (current) drug therapy: Secondary | ICD-10-CM | POA: Diagnosis not present

## 2023-10-25 DIAGNOSIS — J4530 Mild persistent asthma, uncomplicated: Secondary | ICD-10-CM | POA: Diagnosis not present

## 2023-10-25 HISTORY — DX: Presence of coronary angioplasty implant and graft: Z95.5

## 2023-10-25 HISTORY — DX: Atherosclerotic heart disease of native coronary artery without angina pectoris: I25.10

## 2023-10-25 LAB — CBC
HCT: 32.1 % — ABNORMAL LOW (ref 36.0–46.0)
Hemoglobin: 10.3 g/dL — ABNORMAL LOW (ref 12.0–15.0)
MCH: 28.9 pg (ref 26.0–34.0)
MCHC: 32.1 g/dL (ref 30.0–36.0)
MCV: 90.2 fL (ref 80.0–100.0)
Platelets: 176 10*3/uL (ref 150–400)
RBC: 3.56 MIL/uL — ABNORMAL LOW (ref 3.87–5.11)
RDW: 14.1 % (ref 11.5–15.5)
WBC: 7.9 10*3/uL (ref 4.0–10.5)
nRBC: 0 % (ref 0.0–0.2)

## 2023-10-25 LAB — BASIC METABOLIC PANEL
Anion gap: 7 (ref 5–15)
BUN: 11 mg/dL (ref 8–23)
CO2: 21 mmol/L — ABNORMAL LOW (ref 22–32)
Calcium: 8.7 mg/dL — ABNORMAL LOW (ref 8.9–10.3)
Chloride: 112 mmol/L — ABNORMAL HIGH (ref 98–111)
Creatinine, Ser: 0.91 mg/dL (ref 0.44–1.00)
GFR, Estimated: >60 mL/min (ref >60)
Glucose, Bld: 92 mg/dL (ref 70–99)
Potassium: 3.7 mmol/L (ref 3.5–5.1)
Sodium: 140 mmol/L (ref 135–145)

## 2023-10-25 MED ORDER — ROSUVASTATIN CALCIUM 20 MG PO TABS
20.0000 mg | ORAL_TABLET | Freq: Every day | ORAL | 3 refills | Status: DC
  Filled 2023-10-25: qty 90, 90d supply, fill #0

## 2023-10-25 MED ORDER — NITROGLYCERIN 0.4 MG SL SUBL
0.4000 mg | SUBLINGUAL_TABLET | SUBLINGUAL | 3 refills | Status: DC | PRN
  Filled 2023-10-25: qty 25, 1d supply, fill #0

## 2023-10-25 MED ORDER — ASPIRIN 81 MG PO TBEC
81.0000 mg | DELAYED_RELEASE_TABLET | Freq: Every day | ORAL | 3 refills | Status: AC
  Filled 2023-10-25: qty 90, 90d supply, fill #0

## 2023-10-25 MED ORDER — NITROGLYCERIN 0.4 MG SL SUBL
0.4000 mg | SUBLINGUAL_TABLET | SUBLINGUAL | 3 refills | Status: AC | PRN
Start: 2023-10-25 — End: 2024-10-24
  Filled 2023-10-25: qty 100, 75d supply, fill #0

## 2023-10-25 MED ORDER — ROSUVASTATIN CALCIUM 20 MG PO TABS
20.0000 mg | ORAL_TABLET | Freq: Every day | ORAL | Status: DC
  Administered 2023-10-25: 20 mg via ORAL
  Filled 2023-10-25: qty 1

## 2023-10-25 MED ORDER — CLOPIDOGREL BISULFATE 75 MG PO TABS
75.0000 mg | ORAL_TABLET | Freq: Every day | ORAL | 3 refills | Status: DC
  Filled 2023-10-25: qty 90, 90d supply, fill #0

## 2023-10-25 MED ORDER — LISINOPRIL 10 MG PO TABS
10.0000 mg | ORAL_TABLET | Freq: Every day | ORAL | Status: DC
  Filled 2023-10-25: qty 1

## 2023-10-25 NOTE — Discharge Summary (Addendum)
Discharge Summary    Patient ID: Frances Newman MRN: [id number]; DOB: 08-29-1946  Admit date: 10/24/2023 Discharge date: 10/25/2023  PCP:  Crist Fat, MD   [facility] Providers Cardiologist:  Elder Negus, MD   {  Discharge Diagnoses    Principal Problem:   Coronary artery disease with stable angina pectoris [facility]) Active Problems:   Essential hypertension   Hypercholesterolemia   Post PTCA   Status post insertion of drug eluting coronary artery stent  Diagnostic Studies/Procedures    Cardiac catheterization (10/24/2023) LM: Normal. Distal 30% stenosis with mild calcification LAD: Ostial 20% stenosis with mild calcification.         Rest of the vessel has mild diffuse disease Lcx: Mild diffuse disease RCA: Tortuous vessel with prox 90% stenosis   LVEDP 17 mmHg   Successful percutaneous coronary intervention prox RCA        PTCA and stent placement 2.75 X 12 mm Synergy drug-eluting stent        Post dilatation using 3.0X10 mm [place] balloon up to 24 atm  I do have small suspicion for a distal edge dissection.  However, it is at the location beyond which I was not able to pass either a balloon, stent, or IVUS.  Patient is completely stable without any chest pain or EKG changes.  I am cautiously optimistic that minimal dissection may heal by itself.  Nevertheless, I will keep the patient in the hospital overnight, hydrate her well given 240 cc of contrast used.  Should she have any chest pain, she would need stat EKG and possibly repeat cath.   Unusually delayed procedure time due to complex intervention for the following reasons: Tortuous vessel with the use multiple balloons, difficulty advancing balloons and stents, difficulty advancing IVUS. _____________   History of Present Illness     Frances Newman is a 78 y.o. female with history of hypertension, hypercholesterolemia, PAD, obesity, diet controlled T2DM. She presented to outpatient office  complaining of chest tightness radiating to her left arm. She stated that she normally uses nitroglycerin for relief. She reports a history of previous myocardial infarction. After discussing her options for evaluation patient wishes to proceed with cardiac catheterization. Later that day she was seen for left heart catheterization.   Hospital Course    She arrived to [facility] for cardiac catheterization on 10/24/2023. Her left  heart cath showed normal left main with 30% stenosis distally with mild calcification, 20% ostial LAD stenosis with mild calcification, diffuse disease rest of LAD, mild diffuse disease left circumflex, and tortuous RCA with 90% proximal RCA stenosis. There was successful PCI with PCTA and stent placement 2.75 X 12 mm Synergy drug-eluting stent in proximal RCA.   Dr. Rosemary Holms noted in his cath report that there was suspicion for distal edge dissection but due to the location he was unable to pass a  balloon, stent, or IVUS. Patient was stable without chest pain or EKG changes. He believes there is a possibility that the dissection may heal itself. Patient was kept overnight in the hospital for observation to monitor for any chest pain.  Patient denied any acute events overnight. No chest pain or shortness of breath. She only complains of chronic right sided hip pain that radiates to her right leg. She was seen this morning by MD, who states patient is ready for discharge with close hospital follow up.   Coronary artery disease with stable angina pectoris s/p PCI 10/24/2023  -- Continue  DAPT (ASA 81 mg and Plavix 75 mg daily) for at least 6 months then plavix monotherapy indefinitely -- Increase Crestor to 20 mg daily  -- Continue PRN SL nitroglycerin -- Continue Lopressor 12.5 mg BID at discharge  -- Patient had Imdur ordered, but never received dose as she was given PRN labetalol and BP was 95/76, will hold off on starting outpatient and continue to monitor  BP  Hypercholesterolemia -- LDL 88 (9/24) -- Increase Crestor to 20 mg daily  -- LPA pending -- Repeat lipid panel and LFTs in 6 weeks    Essential  hypertension Most recent BP 101/76 -- Continue Lopressor 12.5 mg BID and lisinopril 10 mg daily at discharge  -- Consider titration at follow up, if needed        _____________  Discharge Vitals Blood pressure 101/76, pulse 78, temperature 97.7 F (36.5 C), temperature source Oral, resp. rate 20, height 5\' 5"  (1.651 m), weight 87.1 kg, SpO2 99%.  Filed Weights   10/24/23 0925  Weight: 87.1 kg   Labs & Radiologic Studies    CBC Recent Labs    10/24/23 1823 10/25/23 0520  WBC 7.8 7.9  HGB 8.3* 10.3*  HCT 26.2* 32.1*  MCV 91.6 90.2  PLT 142* 176   Basic Metabolic Panel Recent Labs    16/10/96 1823 10/25/23 0520  NA  --  140  K  --  3.7  CL  --  112*  CO2  --  21*  GLUCOSE  --  92  BUN  --  11  CREATININE 0.50 0.91  CALCIUM  --  8.7*   Liver Function Tests No results for input(s): "AST", "ALT", "ALKPHOS", "BILITOT", "PROT", "ALBUMIN" in the last 72 hours. No results for input(s): "LIPASE", "AMYLASE" in the last 72 hours. High Sensitivity Troponin:   No results for input(s): "TROPONINIHS" in the last 720 hours.  BNP Invalid input(s): "POCBNP" D-Dimer No results for input(s): "DDIMER" in the last 72 hours. Hemoglobin A1C No results for input(s): "HGBA1C" in the last 72 hours. Fasting Lipid Panel No results for input(s): "CHOL", "HDL", "LDLCALC", "TRIG", "CHOLHDL", "LDLDIRECT" in the last 72 hours. Thyroid Function Tests No results for input(s): "TSH", "T4TOTAL", "T3FREE", "THYROIDAB" in the last 72 hours.  Invalid input(s): "FREET3" _____________  CARDIAC CATHETERIZATION Result Date: 10/24/2023 Images from the original result were not included. Coronary intervention 10/24/2023: LM: Normal. Distal 30% stenosis with mild calcification LAD: Ostial 20% stenosis with mild calcification.         Rest of the vessel  has mild diffuse disease Lcx: Mild diffuse disease RCA: Tortuous vessel with prox 90% stenosis LVEDP 17 mmHg Successful percutaneous coronary intervention prox RCA        PTCA and stent placement 2.75 X 12 mm Synergy drug-eluting stent        Post dilatation using 3.0X10 mm [place] balloon up to 24 atm I do have small suspicion for a distal edge dissection.  However, it is at the location beyond which I was not able to pass either a balloon, stent, or IVUS.  Patient is completely stable without any chest pain or EKG changes.  I am cautiously optimistic that minimal dissection may heal by itself.  Nevertheless, I will keep the patient in the hospital overnight, hydrate her well given 240 cc of contrast used.  Should she have any chest pain, she would need stat EKG and possibly repeat cath. Unusually delayed procedure time due to complex intervention for the following reasons: Tortuous vessel with  the use multiple balloons, difficulty advancing balloons and stents, difficulty advancing IVUS. Manish Emiliano Dyer, MD  Disposition   Pt is being discharged home today in good condition.  Follow-up Plans & Appointments   Follow up appointment scheduled 11/04/2023  Follow-up Information     Patwardhan, Anabel Bene, MD Follow up.   Specialties: Cardiology, Radiology Why: Follow up made 11/04/2023 Contact information: [address] [place] [address] [phone]                Discharge Instructions     AMB Referral to Cardiac Rehabilitation - Phase II   Complete by: As directed    Diagnosis: Coronary Stents   After initial evaluation and assessments completed: Virtual Based Care may be provided alone or in conjunction with Phase 2 Cardiac Rehab based on patient barriers.: Yes   Intensive Cardiac Rehabilitation (ICR) [facility] location only OR Traditional Cardiac Rehabilitation (TCR) *If criteria for ICR are not met will enroll in TCR [facility] only): Yes   Call MD for:  persistant  dizziness or light-headedness   Complete by: As directed    Call MD for:  redness, tenderness, or signs of infection (pain, swelling, redness, odor or green/yellow discharge around incision site)   Complete by: As directed    Diet - low sodium heart healthy   Complete by: As directed    Discharge instructions   Complete by: As directed    Radial Site Care Refer to this sheet in the next few weeks. These instructions provide you with information on caring for yourself after your procedure. Your caregiver may also give you more specific instructions. Your treatment has been planned according to current medical practices, but problems sometimes occur. Call your caregiver if you have any problems or questions after your procedure. HOME CARE INSTRUCTIONS You may shower the day after the procedure. Remove the bandage (dressing) and gently wash the site with plain soap and water. Gently pat the site dry.  Do not apply powder or lotion to the site.  Do not submerge the affected site in water for 3 to 5 days.  Inspect the site at least twice daily.  Do not flex or bend the affected arm for 24 hours.  No lifting over 5 pounds (2.3 kg) for 5 days after your procedure.  Do not drive home if you are discharged the same day of the procedure. Have someone else drive you.  You may drive 24 hours after the procedure unless otherwise instructed by your caregiver.  What to expect: Any bruising will usually fade within 1 to 2 weeks.  Blood that collects in the tissue (hematoma) may be painful to the touch. It should usually decrease in size and tenderness within 1 to 2 weeks.  SEEK IMMEDIATE MEDICAL CARE IF: You have unusual pain at the radial site.  You have redness, warmth, swelling, or pain at the radial site.  You have drainage (other than a small amount of blood on the dressing).  You have chills.  You have a fever or persistent symptoms for more than 72 hours.  You have a fever and your symptoms  suddenly get worse.  Your arm becomes pale, cool, tingly, or numb.  You have heavy bleeding from the site. Hold pressure on the site.   PLEASE DO NOT MISS ANY DOSES OF YOUR PLAVIX!!!!! Also keep a log of you blood pressures and bring back to your follow up appt. Please call the office with any questions.  Patients taking blood thinners should generally stay away from medicines like ibuprofen, Advil, Motrin, naproxen, and Aleve due to risk of stomach bleeding. You may take Tylenol as directed or talk to your primary doctor about alternatives.  PLEASE ENSURE THAT YOU DO NOT RUN OUT OF YOUR PLAVIX.  IF you have issues obtaining this medication due to cost please CALL the office 3-5 business days prior to running out in order to prevent missing doses of this medication.   Increase activity slowly   Complete by: As directed       Discharge Medications   Allergies as of 10/25/2023       Reactions   Levaquin [levofloxacin In D5w]    Throat closes up.   Zithromax [azithromycin]    Causes a rash   Prednisone Other (See Comments)   Hypotension        Medication List     STOP taking these medications    isosorbide mononitrate 30 MG 24 hr tablet Commonly known as: IMDUR       TAKE these medications    albuterol 108 (90 Base) MCG/ACT inhaler Commonly known as: VENTOLIN HFA Inhale 2 puffs into the lungs every 4 (four) hours as needed for wheezing or shortness of breath (Asthma).   ALPRAZolam 0.25 MG tablet Commonly known as: XANAX Take 1 tablet (0.25 mg total) by mouth daily as needed for anxiety.   aspirin 81 MG tablet Take 1 tablet (81 mg total) by mouth daily. What changed: when to take this   azelastine 0.1 % nasal spray Commonly known as: ASTELIN Place 1 spray into both nostrils 2 (two) times daily. Use in each nostril as directed   CALCIUM PO Take 600 mg by mouth 2 (two) times daily.   clopidogrel 75 MG tablet Commonly known as: PLAVIX Take 1 tablet (75 mg  total) by mouth daily with breakfast. Start taking on: October 26, 2023   cyclobenzaprine 5 MG tablet Commonly known as: FLEXERIL Take 1 tablet (5 mg total) by mouth 3 (three) times daily as needed for muscle spasms.   dexlansoprazole 60 MG capsule Commonly known as: DEXILANT Take 60 mg by mouth daily.   diclofenac Sodium 1 % Gel Commonly known as: VOLTAREN Apply 4 g topically 2 (two) times daily. What changed:  when to take this reasons to take this   donepezil 5 MG tablet Commonly known as: ARICEPT Take 5 mg by mouth at bedtime.   escitalopram 20 MG tablet Commonly known as: Lexapro Take 1.5 tablets (30 mg total) by mouth daily.   famotidine 40 MG tablet Commonly known as: PEPCID TAKE 1 TABLET BY MOUTH EVERY EVENING.   ferrous sulfate 325 (65 FE) MG EC tablet Take 325 mg by mouth daily.   fluticasone 50 MCG/ACT nasal spray Commonly known as: FLONASE Place 1 spray into both nostrils daily.   fluticasone furoate-vilanterol 200-25 MCG/ACT Aepb Commonly known as: Breo Ellipta Inhale 1 puff into the lungs daily.   gabapentin 300 MG capsule Commonly known as: NEURONTIN Take 1 capsule (300 mg total) by mouth at bedtime.   hydrOXYzine 10 MG tablet Commonly known as: ATARAX Take 1 tablet (10 mg total) by mouth every 8 (eight) hours as needed.   ipratropium-albuterol 0.5-2.5 (3) MG/3ML Soln Commonly known as: DUONEB Take 3 mLs by nebulization every 4 (four) hours as needed (Asthma).   lisinopril 10 MG tablet Commonly known as: ZESTRIL TAKE 1 TABLET BY MOUTH DAILY.   loratadine 10 MG tablet Commonly known as: CLARITIN  TAKE 1 TABLET ONCE A DAY   metoprolol tartrate 25 MG tablet Commonly known as: LOPRESSOR TAKE 1/2 TABLET BY MOUTH TWICE DAILY   montelukast 10 MG tablet Commonly known as: SINGULAIR TAKE 1 TABLET BY MOUTH ONCE A DAY   nitroGLYCERIN 0.4 MG SL tablet Commonly known as: NITROSTAT Place 0.4 mg under the tongue every 5 (five) minutes as needed  for chest pain. If no relief after 3 doses call 911   nitroGLYCERIN 0.4 MG SL tablet Commonly known as: Nitrostat Place 1 tablet (0.4 mg total) under the tongue every 5 (five) minutes as needed for chest pain.   nitroGLYCERIN 0.4 MG SL tablet Commonly known as: NITROSTAT Place 1 tablet (0.4 mg total) under the tongue every 5 (five) minutes as needed for chest pain.   Olopatadine HCl 0.2 % Soln Commonly known as: Pataday Place 1 drop into both eyes 1 day or 1 dose.   ondansetron 4 MG tablet Commonly known as: Zofran Take 1 tablet (4 mg total) by mouth every 8 (eight) hours as needed for nausea or vomiting.   potassium chloride 10 MEQ CR capsule Commonly known as: MICRO-K TAKE 1 CAPSULE BY MOUTH ONCE DAILY   rosuvastatin 20 MG tablet Commonly known as: CRESTOR Take 1 tablet (20 mg total) by mouth daily. Start taking on: October 26, 2023 What changed:  medication strength how much to take   Vitamin D (Ergocalciferol) 1.25 MG (50000 UNIT) Caps capsule Commonly known as: DRISDOL Take 1 capsule (50,000 Units total) by mouth every 7 (seven) days.   Xtampza ER 9 MG C12a Generic drug: oxyCODONE ER Take 1 capsule by mouth in the morning and at bedtime.         Outstanding Labs/Studies   Outpatient lipid panel, LFTs needed in 6 weeks    Duration of Discharge Encounter: APP Time: 25 minutes   Signed, Olena Leatherwood, PA-C 10/25/2023, 10:17 AM   ATTENDING ATTESTATION:  After conducting a review of all available clinical information with the care team, interviewing the patient, and performing a physical exam, I agree with the findings and plan described in this note.   GEN: No acute distress.   Neck: No JVD Cardiac: RRR, no murmurs, rubs, or gallops.  Respiratory: Clear to auscultation bilaterally. GI: Soft, nontender, non-distended  MS: No edema VASC:  R radial CDI  Coronary artery disease with stable angina pectoris s/p PCI 10/24/2023 Patient presented with  chest pain and past history of MI  Underwent HLC yesterday which showed: 90% stenosis of proximal RCA, successful PTCA and DES placement in proximal RCA with 0% residual stenosis  There was also noted a potential for distal edge dissection, unable to be reached by balloon, stent, or IVUS. Patient was asymptomatic, if she develops chest pain would need stat EKG and potential to repeat cath. -- Continue DAPT (ASA 81 mg and Plavix 75 mg daily) for at least 6 months then plavix monotherapy indefinitely -- Stop Imdur  -- Increase Crestor to 20 mg daily  --Cont PRN NTG   Hypercholesterolemia --LDL 88, increase Crestor 20   Essential  hypertension Most recent BP 167/70; restart home meds   Dispo:  D/C today with close hospital follow up.  Alverda Skeans, MD Pager [phone]

## 2023-10-25 NOTE — Progress Notes (Signed)
   Patient Name: Frances Newman Date of Encounter: 10/25/2023 [facility] Cardiologist: Revankar  Interval Summary  .    S/p PCI pRCA  No acute events overnight  Patient denies chest pain or dyspnea.  Biggest complaint is chronic R hip pain radiating to R leg.  Vital Signs .    Vitals:   10/25/23 0110 10/25/23 0539 10/25/23 0730 10/25/23 0812  BP: (!) 167/68 (!) 181/76  (!) 167/70  Pulse: 64 64  78  Resp: 18 20 16 20   Temp: 98.3 F (36.8 C) 98.1 F (36.7 C)  97.7 F (36.5 C)  TempSrc: Oral Oral  Oral  SpO2: 98% 99%    Weight:      Height:       Intake/Output Summary (Last 24 hours) at 10/25/2023 0916 Last data filed at 10/24/2023 1913 Gross per 24 hour  Intake 1128.53 ml  Output 300 ml  Net 828.53 ml      10/24/2023    9:25 AM 10/08/2023    8:57 AM 09/12/2023   10:13 AM  Last 3 Weights  Weight (lbs) 192 lb 192 lb 12.8 oz 192 lb  Weight (kg) 87.091 kg 87.454 kg 87.091 kg      Telemetry/ECG    NSR with artifact - Personally Reviewed  Physical Exam .   GEN: No acute distress.   Neck: No JVD Cardiac: RRR, no murmurs, rubs, or gallops.  Respiratory: Clear to auscultation bilaterally. GI: Soft, nontender, non-distended  MS: No edema VASC:  R radial CDI  Assessment & Plan .     Coronary artery disease with stable angina pectoris s/p PCI 10/24/2023 Patient presented with chest pain and past history of MI  Underwent HLC yesterday which showed: 90% stenosis of proximal RCA, successful PTCA and DES placement in proximal RCA with 0% residual stenosis  There was also noted a potential for distal edge dissection, unable to be reached by balloon, stent, or IVUS. Patient was asymptomatic, if she develops chest pain would need stat EKG and potential to repeat cath. -- Continue DAPT (ASA 81 mg and Plavix 75 mg daily) for at least 6 months then plavix monotherapy indefinitely -- Stop Imdur  -- Increase Crestor to 20 mg daily  --Cont PRN  NTG  Hypercholesterolemia --LDL 88, increase Crestor 20  Essential  hypertension Most recent BP 167/70; restart home meds  Dispo:  D/C today with close hospital follow up.   For questions or updates, please contact [facility] Please consult [email or web address] for contact info under       Signed, Olena Leatherwood, PA-C

## 2023-10-25 NOTE — Progress Notes (Signed)
CARDIAC REHAB PHASE I   PRE:  Rate/Rhythm: 77 SR  BP:  Sitting: 103/65      SaO2: 98 RA  MODE:  Ambulation: 220 ft   POST:  Rate/Rhythm: 81 SR  BP:  Sitting: 95/76      SaO2: 98 RA  Pt tolerated exercise well and amb 220 ft with walker, and stand-by assist. Pt denies CP, SOB, or dizziness throughout walk. MD came in after walk, so I cam back to do ed with pt. Discussed restrictions, ASA and Plavix, stent, site care, heart healthy diet, and exercise guidelines. Pt left in bed w/ call bell in reach. Will refer to [facility] [place]. Will continue to follow.  [id number], [phone] Joya San, MS, ACSM-CEP 10/25/2023 10:11 AM

## 2023-10-25 NOTE — TOC CM/SW Note (Signed)
Transition of Care [facility]) - Inpatient Brief Assessment   Patient Details  Name: Frances Newman MRN: [id number] Date of Birth: [birth year]  Transition of Care [facility]) CM/SW Contact:    Gala Lewandowsky, RN Phone Number: 10/25/2023, 9:08 AM   Clinical Narrative: Patient presented for chest pain-post LHC. Per noes, plan for home on Plavix. Patient has insurance and PCP. No home needs identified at this time.   Transition of Care Asessment: Insurance and Status: Insurance coverage has been reviewed Patient has primary care physician: Yes Prior/Current Home Services: No current home services Social Drivers of Health Review: SDOH reviewed no interventions necessary Readmission risk has been reviewed: Yes Transition of care needs: no transition of care needs at this time

## 2023-10-29 LAB — LIPOPROTEIN A (LPA): Lipoprotein (a): 209.6 nmol/L — ABNORMAL HIGH (ref <75.0)

## 2023-11-04 ENCOUNTER — Telehealth (HOSPITAL_COMMUNITY): Payer: Self-pay

## 2023-11-04 ENCOUNTER — Encounter: Payer: Self-pay | Admitting: Cardiology

## 2023-11-04 ENCOUNTER — Ambulatory Visit: Payer: 59 | Attending: Cardiology | Admitting: Cardiology

## 2023-11-04 VITALS — BP 142/80 | HR 70 | Ht 65.0 in | Wt 192.4 lb

## 2023-11-04 DIAGNOSIS — E782 Mixed hyperlipidemia: Secondary | ICD-10-CM | POA: Insufficient documentation

## 2023-11-04 DIAGNOSIS — I2510 Atherosclerotic heart disease of native coronary artery without angina pectoris: Secondary | ICD-10-CM

## 2023-11-04 DIAGNOSIS — I10 Essential (primary) hypertension: Secondary | ICD-10-CM | POA: Diagnosis not present

## 2023-11-04 HISTORY — DX: Mixed hyperlipidemia: E78.2

## 2023-11-04 MED ORDER — CLOPIDOGREL BISULFATE 75 MG PO TABS: 75.0000 mg | ORAL_TABLET | Freq: Every day | ORAL | 1 refills | Status: DC

## 2023-11-04 NOTE — Progress Notes (Signed)
  Cardiology Office Note:  .   Date:  11/04/2023  ID:  Frances Newman, DOB [birth year], MRN [id number] PCP: Crist Fat, MD  [facility] Providers Cardiologist:  Truett Mainland, MD PCP: Crist Fat, MD  Chief Complaint  Patient presents with   Coronary Artery Disease      History of Present Illness: .    Frances Newman is a 78 y.o. female with hypertension, hyperlipidemia, obesity, CAD   Patient sees Dr. Tomie China in hospital for cardiology follow-up.  She was referred to me for coronary angiography and possible intervention.  She underwent complex PCI to proximal RCA on 10/24/2023.  Since the PCI, her chest pain symptoms have resolved.  Blood pressure slightly elevated today, but generally well-controlled.   Vitals:   11/04/23 0952  BP: (!) 142/80  Pulse: 70     ROS:  ROS   Studies Reviewed: Marland Kitchen        EKG 11/04/2023: Normal sinus rhythm Left axis deviation T wave abnormality, consider inferior ischemia T wave abnormality, consider anterolateral ischemia When compared with ECG of 25-Oct-2023 05:52,  T wave inversion was noted in prior EKGs as well   Independently interpreted 10/25/2023: Hb 10.3 Cr 0.91 Lipoprotein (a) 209  06/2023: Chol 189, TG 100, HDL 83, LDL 88 HbA1C 5.5% TSH 1.0  Coronary intervention 10/24/2023: LM: Normal. Distal 30% stenosis with mild calcification LAD: Ostial 20% stenosis with mild calcification.         Rest of the vessel has mild diffuse disease Lcx: Mild diffuse disease RCA: Tortuous vessel with prox 90% stenosis   LVEDP 17 mmHg   Successful percutaneous coronary intervention prox RCA        PTCA and stent placement 2.75 X 12 mm Synergy drug-eluting stent        Post dilatation using 3.0X10 mm [place] balloon up to 24 atm         Physical Exam:   Physical Exam Cardiovascular:     Comments: No wrist hematoma, good capillary refill, 1+ radial pulse Musculoskeletal:     Right lower leg: No edema.     Left  lower leg: No edema.      VISIT DIAGNOSES:   ICD-10-CM   1. Coronary artery disease involving native coronary artery of native heart without angina pectoris  I25.10 EKG 12-Lead    2. Mixed hyperlipidemia  E78.2     3. Essential hypertension  I10        ASSESSMENT AND PLAN: .    Frances Newman is a 78 y.o. female with hypertension, hyperlipidemia, obesity, CAD   CAD: Successful PCI to proximal RCA (10/2023). Recommend aspirin and Plavix for at least 6 months. Continue current antianginal therapy. LDL 88, HDL 83, lipoprotein a elevated at 209.6. Consider PCSK9i therapy to further reduce LDL, and potentially reduce lipoprotein a by 20-30%. Since she follows up with Dr. Tomie China in [place], I will defer initiation to his office.  Hypertension: Generally well-controlled. No changes made today.      Meds ordered this encounter  Medications   clopidogrel (PLAVIX) 75 MG tablet    Sig: Take 1 tablet (75 mg total) by mouth daily with breakfast.    Dispense:  90 tablet    Refill:  1     F/u with Dr. Tomie China on 01/01/2024, as currently scheduled.  Signed, Elder Negus, MD

## 2023-11-04 NOTE — Telephone Encounter (Signed)
Phase II referral for Cardiac Rehab faxed to [facility].

## 2023-11-04 NOTE — Patient Instructions (Signed)
Medication Instructions:  Your physician recommends that you continue on your current medications as directed. Please refer to the Current Medication list given to you today.   Follow-Up: At [facility], you and your health needs are our priority.  As part of our continuing mission to provide you with exceptional heart care, we have created designated Provider Care Teams.  These Care Teams include your primary Cardiologist (physician) and Advanced Practice Providers (APPs -  Physician Assistants and Nurse Practitioners) who all work together to provide you with the care you need, when you need it.  We recommend signing up for the patient portal called "MyChart".  Sign up information is provided on this After Visit Summary.  MyChart is used to connect with patients for Virtual Visits (Telemedicine).  Patients are able to view lab/test results, encounter notes, upcoming appointments, etc.  Non-urgent messages can be sent to your provider as well.   To learn more about what you can do with MyChart, go to [email or web address].    Your next appointment:   As scheduled 01/01/24 at 9:20 with Revankar  Other Instructions   1st Floor: - Lobby - Registration  - Pharmacy  - Lab - Cafe  2nd Floor: - PV Lab - Diagnostic Testing (echo, CT, nuclear med)  3rd Floor: - Vacant  4th Floor: - TCTS (cardiothoracic surgery) - AFib Clinic - Structural Heart Clinic - Vascular Surgery  - Vascular Ultrasound  5th Floor: - [facility] (general and EP) - Clinical Pharmacy for coumadin, hypertension, lipid, weight-loss medications, and med management appointments    Valet parking services will be available as well.

## 2023-11-05 ENCOUNTER — Other Ambulatory Visit: Payer: Self-pay | Admitting: Internal Medicine

## 2023-11-07 ENCOUNTER — Other Ambulatory Visit: Payer: Self-pay | Admitting: Internal Medicine

## 2023-11-07 ENCOUNTER — Other Ambulatory Visit: Payer: Self-pay

## 2023-11-07 MED ORDER — FLUTICASONE FUROATE-VILANTEROL 200-25 MCG/ACT IN AEPB: 1.0000 | INHALATION_SPRAY | Freq: Every day | RESPIRATORY_TRACT | 5 refills | Status: AC

## 2023-11-08 ENCOUNTER — Ambulatory Visit: Payer: 59 | Admitting: Internal Medicine

## 2023-11-08 ENCOUNTER — Encounter: Payer: Self-pay | Admitting: Internal Medicine

## 2023-11-08 ENCOUNTER — Other Ambulatory Visit: Payer: Self-pay | Admitting: Internal Medicine

## 2023-11-08 VITALS — BP 118/76 | HR 76 | Temp 98.8°F | Resp 16 | Ht 65.0 in | Wt 194.2 lb

## 2023-11-08 DIAGNOSIS — J4531 Mild persistent asthma with (acute) exacerbation: Secondary | ICD-10-CM | POA: Diagnosis not present

## 2023-11-08 DIAGNOSIS — R059 Cough, unspecified: Secondary | ICD-10-CM

## 2023-11-08 HISTORY — DX: Cough, unspecified: R05.9

## 2023-11-08 LAB — POC INFLUENZA A&B (BINAX/QUICKVUE)
Influenza A, POC: NEGATIVE
Influenza B, POC: NEGATIVE

## 2023-11-08 LAB — POC COVID19 BINAXNOW: SARS Coronavirus 2 Ag: NEGATIVE

## 2023-11-08 MED ORDER — FLUTICASONE PROPIONATE 50 MCG/ACT NA SUSP: 1.0000 | Freq: Two times a day (BID) | NASAL | 0 refills | Status: DC

## 2023-11-08 MED ORDER — PREDNISONE 20 MG PO TABS: 20.0000 mg | ORAL_TABLET | Freq: Every day | ORAL | 0 refills | Status: AC

## 2023-11-08 MED ORDER — DOXYCYCLINE MONOHYDRATE 100 MG PO CAPS: 100.0000 mg | ORAL_CAPSULE | Freq: Two times a day (BID) | ORAL | 0 refills | Status: DC
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
## 2023-11-08 NOTE — Assessment & Plan Note (Signed)
She has some sinusitis with clear nasal discharge in the back of her throat on exam.  Her COVID-19 and flu tests were negative.  She states she has had some wheezing and has been on prednisone in the past without problems.  I am going to put her on doxycyline, stop her astelin spray for now and start on flonase nasal spray and continue claritin.  I will place her on a 5 day burst dose of prednisone.  Continue supportive care.

## 2023-11-11 ENCOUNTER — Other Ambulatory Visit: Payer: Self-pay | Admitting: Internal Medicine

## 2023-11-11 DIAGNOSIS — I10 Essential (primary) hypertension: Secondary | ICD-10-CM | POA: Diagnosis not present

## 2023-11-11 DIAGNOSIS — H25043 Posterior subcapsular polar age-related cataract, bilateral: Secondary | ICD-10-CM | POA: Diagnosis not present

## 2023-11-11 DIAGNOSIS — H02831 Dermatochalasis of right upper eyelid: Secondary | ICD-10-CM | POA: Diagnosis not present

## 2023-11-11 DIAGNOSIS — H2513 Age-related nuclear cataract, bilateral: Secondary | ICD-10-CM | POA: Diagnosis not present

## 2023-11-14 ENCOUNTER — Telehealth: Payer: Self-pay | Admitting: Cardiology

## 2023-11-14 NOTE — Telephone Encounter (Signed)
   Pre-operative Risk Assessment    Patient Name: Frances Newman  DOB: 20-Jul-1946 MRN: [id number]   Date of last office visit: 11/04/23 Date of next office visit: 12/31/23   Request for Surgical Clearance    Procedure:  teeth cleaning   Date of Surgery:  Clearance 11/14/23                                Surgeon:  Dr. Kip  Surgeon's Group or Practice Name:  [facility]  Phone number:  [phone] Fax number:  [phone]   Type of Clearance Requested:   - Medical    Type of Anesthesia:  None    Additional requests/questions:      SignedBarbee DELENA Sharps   11/14/2023, 9:07 AM

## 2023-11-14 NOTE — Telephone Encounter (Signed)
    Primary Cardiologist: Jennifer JONELLE Crape, MD  Chart reviewed as part of pre-operative protocol coverage. Simple dental extractions and dental cleanings are considered low risk procedures per guidelines and generally do not require any specific cardiac clearance. It is also generally accepted that for simple extractions and dental cleanings, there is no need to interrupt blood thinner therapy.   SBE prophylaxis is not required for the patient.  I will route this recommendation to the requesting party via Epic fax function and remove from pre-op pool.  Please call with questions.  Day Greb D Bernie Fobes, NP 11/14/2023, 9:13 AM

## 2023-11-17 ENCOUNTER — Other Ambulatory Visit: Payer: Self-pay | Admitting: Internal Medicine

## 2023-11-17 MED ORDER — XTAMPZA ER 9 MG PO C12A: 1.0000 | EXTENDED_RELEASE_CAPSULE | Freq: Two times a day (BID) | ORAL | 0 refills | Status: DC

## 2023-11-21 DIAGNOSIS — M62571 Muscle wasting and atrophy, not elsewhere classified, right ankle and foot: Secondary | ICD-10-CM | POA: Diagnosis not present

## 2023-11-21 DIAGNOSIS — M62572 Muscle wasting and atrophy, not elsewhere classified, left ankle and foot: Secondary | ICD-10-CM | POA: Diagnosis not present

## 2023-12-03 ENCOUNTER — Encounter: Payer: Self-pay | Admitting: Gastroenterology

## 2023-12-04 ENCOUNTER — Other Ambulatory Visit: Payer: Self-pay | Admitting: Internal Medicine

## 2023-12-05 ENCOUNTER — Ambulatory Visit: Payer: 59 | Admitting: Internal Medicine

## 2023-12-05 ENCOUNTER — Encounter: Payer: Self-pay | Admitting: Internal Medicine

## 2023-12-05 VITALS — BP 136/84 | HR 85 | Temp 98.4°F | Resp 17 | Ht 65.0 in | Wt 192.2 lb

## 2023-12-05 DIAGNOSIS — I2510 Atherosclerotic heart disease of native coronary artery without angina pectoris: Secondary | ICD-10-CM

## 2023-12-05 DIAGNOSIS — I10 Essential (primary) hypertension: Secondary | ICD-10-CM

## 2023-12-05 DIAGNOSIS — G894 Chronic pain syndrome: Secondary | ICD-10-CM | POA: Diagnosis not present

## 2023-12-05 MED ORDER — GABAPENTIN 300 MG PO CAPS: 300.0000 mg | ORAL_CAPSULE | Freq: Two times a day (BID) | ORAL | 2 refills | Status: DC

## 2023-12-05 MED ORDER — XTAMPZA ER 9 MG PO C12A: 1.0000 | EXTENDED_RELEASE_CAPSULE | Freq: Two times a day (BID) | ORAL | 0 refills | Status: DC

## 2023-12-05 NOTE — Assessment & Plan Note (Signed)
 She is having continued pain in her right hip and right leg but her back pain and shoulder pain is controlled.  [facility] wanted [facility] to complete PT before doing another [facility] where she states she starts PT next month.  I reviewed her PDMP/[place] CSR.  I will continue her on xtampza 9mg  BID and I am going to try increase her gabapentin from 300mg  at bedtime to 300mg  BID.  Somnolence was discussed with the patient.

## 2023-12-05 NOTE — Assessment & Plan Note (Signed)
 I reviewed her notes from the hospital and from cardiology.  She is s/p LHC with stent placement.  She is on DAPT for 6 months.  Her discharge medications were reviewed and they stopped her off imdur.  She will need repeat FLP in about 4 weeks and consideration of a PSK9 inhibitor.  She denies any angina pain today.  She will followup with cardiology next month.
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
## 2023-12-05 NOTE — Assessment & Plan Note (Signed)
 Her BP is controlled on her current medications.  She is on metoprolol and lisinopril.

## 2023-12-18 ENCOUNTER — Other Ambulatory Visit: Payer: Self-pay | Admitting: Internal Medicine

## 2023-12-19 DIAGNOSIS — M62572 Muscle wasting and atrophy, not elsewhere classified, left ankle and foot: Secondary | ICD-10-CM | POA: Diagnosis not present

## 2023-12-19 DIAGNOSIS — M62571 Muscle wasting and atrophy, not elsewhere classified, right ankle and foot: Secondary | ICD-10-CM | POA: Diagnosis not present

## 2023-12-24 ENCOUNTER — Other Ambulatory Visit: Payer: Self-pay | Admitting: Internal Medicine

## 2023-12-26 DIAGNOSIS — H2512 Age-related nuclear cataract, left eye: Secondary | ICD-10-CM | POA: Diagnosis not present

## 2023-12-27 ENCOUNTER — Other Ambulatory Visit: Payer: Self-pay | Admitting: Internal Medicine

## 2023-12-27 DIAGNOSIS — H2511 Age-related nuclear cataract, right eye: Secondary | ICD-10-CM | POA: Diagnosis not present

## 2023-12-27 DIAGNOSIS — H25041 Posterior subcapsular polar age-related cataract, right eye: Secondary | ICD-10-CM | POA: Diagnosis not present

## 2023-12-27 MED ORDER — ALPRAZOLAM 0.25 MG PO TABS: 0.2500 mg | ORAL_TABLET | Freq: Every day | ORAL | 2 refills | Status: DC | PRN

## 2023-12-31 ENCOUNTER — Other Ambulatory Visit: Payer: Self-pay | Admitting: Internal Medicine

## 2023-12-31 MED ORDER — ALPRAZOLAM 0.25 MG PO TABS: 0.2500 mg | ORAL_TABLET | Freq: Every day | ORAL | 2 refills | Status: DC | PRN

## 2024-01-01 ENCOUNTER — Ambulatory Visit: Payer: 59 | Attending: Cardiology | Admitting: Cardiology

## 2024-01-01 ENCOUNTER — Other Ambulatory Visit: Payer: Self-pay | Admitting: Internal Medicine

## 2024-01-08 ENCOUNTER — Other Ambulatory Visit: Payer: Self-pay

## 2024-01-08 MED ORDER — DEXLANSOPRAZOLE 60 MG PO CPDR: 60.0000 mg | DELAYED_RELEASE_CAPSULE | Freq: Every day | ORAL | 2 refills | Status: DC

## 2024-01-14 ENCOUNTER — Other Ambulatory Visit: Payer: Self-pay | Admitting: Internal Medicine

## 2024-01-14 DIAGNOSIS — F411 Generalized anxiety disorder: Secondary | ICD-10-CM

## 2024-01-15 ENCOUNTER — Other Ambulatory Visit: Payer: Self-pay | Admitting: Internal Medicine

## 2024-01-15 DIAGNOSIS — F411 Generalized anxiety disorder: Secondary | ICD-10-CM

## 2024-01-15 MED ORDER — HYDROXYZINE HCL 10 MG PO TABS: 10.0000 mg | ORAL_TABLET | Freq: Three times a day (TID) | ORAL | 1 refills | Status: DC | PRN

## 2024-01-19 DIAGNOSIS — M62572 Muscle wasting and atrophy, not elsewhere classified, left ankle and foot: Secondary | ICD-10-CM | POA: Diagnosis not present

## 2024-01-19 DIAGNOSIS — M62571 Muscle wasting and atrophy, not elsewhere classified, right ankle and foot: Secondary | ICD-10-CM | POA: Diagnosis not present

## 2024-01-21 ENCOUNTER — Other Ambulatory Visit: Payer: Self-pay | Admitting: Internal Medicine

## 2024-01-23 ENCOUNTER — Other Ambulatory Visit: Payer: Self-pay | Admitting: Internal Medicine

## 2024-01-23 MED ORDER — XTAMPZA ER 9 MG PO C12A: 1.0000 | EXTENDED_RELEASE_CAPSULE | Freq: Two times a day (BID) | ORAL | 0 refills | Status: DC

## 2024-01-29 ENCOUNTER — Other Ambulatory Visit: Payer: Self-pay | Admitting: Internal Medicine

## 2024-01-31 DIAGNOSIS — H25812 Combined forms of age-related cataract, left eye: Secondary | ICD-10-CM | POA: Diagnosis not present

## 2024-02-10 NOTE — Progress Notes (Signed)
 Cardiology Office Note:  .   Date:  02/11/2024  ID:  Frances Newman, DOB Mar 22, 1946, MRN [id number] PCP: Wayne Haines, MD  [facility] Providers Cardiologist:  Nelia Balzarine, MD    History of Present Illness: .   Frances Newman is a 78 y.o. female with a past medical history of CAD s/p DES x 1 pRCA, OSA not on CPAP, hypertension, PAD, asthma, GERD, DM 2, chronic pain syndrome, dyslipidemia, elevated LP(a).  10/24/2023 left heart cath DES x 1 proximal RCA  [facility] with [facility] in December 20[facility] for chest pain, was consistent with angina.  Decision was made to proceed with left heart catheterization.  This was completed on 10/24/2023 with DES x 1 to proximal RCA.  Most recently she was followed up with Dr. Filiberto Hug on 11/04/2023, recommendations for DAPT x 6 months, no further changes were made to plan of care and she was advised to keep her follow-up with Dr. Lafayette Pierre.  She presents today for follow-up of her CAD.  She is not having further episodes of chest pain.  She is mostly bothered by fatigue, and questions why she might be tired all the time.  She apparently was diagnosed with sleep apnea however was not able to tolerate her CPAP and has not followed back up with her provider regarding this.  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  Cardiac rehab was arranged after her heart cath however she simply had too much going on and said she missed the details surrounding cardiac rehab.  She is also bothered by shortness of breath, she states this has been going on for several years.  She denies chest pain, palpitations, dyspnea, pnd, orthopnea, n, v, dizziness, syncope, edema, weight gain, or early satiety.    ROS: Review of Systems  Constitutional:  Positive for malaise/fatigue.     Studies Reviewed: .        Cardiac Studies & Procedures    ______________________________________________________________________________________________ CARDIAC CATHETERIZATION  CARDIAC CATHETERIZATION 10/24/2023  Conclusion Images from the original result were not included. Coronary intervention 10/24/2023: LM: Normal. Distal 30% stenosis with mild calcification LAD: Ostial 20% stenosis with mild calcification. Rest of the vessel has mild diffuse disease Lcx: Mild diffuse disease RCA: Tortuous vessel with prox 90% stenosis  LVEDP 17 mmHg  Successful percutaneous coronary intervention prox RCA PTCA and stent placement 2.75 X 12 mm Synergy drug-eluting stent Post dilatation using 3.0X10 mm Sidell balloon up to 24 atm    I do have small suspicion for a distal edge dissection.  However, it is at the location beyond which I was not able to pass either a balloon, stent, or IVUS.  Patient is completely stable without any chest pain or EKG changes.  I am cautiously optimistic that minimal dissection may heal by itself.  Nevertheless, I will keep the patient in the hospital overnight, hydrate her well given 240 cc of contrast used.  Should she have any chest pain, she would need stat EKG and possibly repeat cath.  Unusually delayed procedure time due to complex intervention for the following reasons: Tortuous vessel with the use multiple balloons, difficulty advancing balloons and stents, difficulty advancing IVUS.  Manish Corliss Dies, MD  Findings Coronary Findings Diagnostic  Dominance: Right  Left Main Dist LM lesion is 30% stenosed.  Left Anterior Descending There is mild diffuse  disease throughout the vessel. Ost LAD lesion is 20% stenosed. The lesion is calcified.  Left Circumflex Vessel is normal in caliber. There is mild diffuse disease throughout the vessel.  Right Coronary Artery Prox RCA lesion is 90% stenosed. Dist RCA lesion is 40% stenosed.  Intervention  Prox RCA lesion Stent CATH VISTA GUIDE 6FR AR1 guide catheter was  inserted. Lesion crossed with guidewire using a WIRE RUNTHROUGH .[id number]. Pre-stent angioplasty was performed using a BALLN SAPPHIRE 2.75X10. A drug-eluting stent was successfully placed using a SYNERGY XD 2.75X12. Maximum pressure: 12 atm. Inflation time: 3 sec. Stent strut is well apposed. Post-stent angioplasty was performed using a BALL SAPPHIRE NC24 3.0X10. Maximum pressure:  24 atm. Inflation time:  20 sec. Post-Intervention Lesion Assessment The intervention was successful. Pre-interventional TIMI flow is 3. Post-intervention TIMI flow is 3. No complications occurred at this lesion. There is a 0% residual stenosis post intervention.              ______________________________________________________________________________________________      Risk Assessment/Calculations:     HYPERTENSION CONTROL Vitals:   02/11/24 0949 02/11/24 1036  BP: (!) 144/80 (!) 144/68    The patient's blood pressure is elevated above target today.  In order to address the patient's elevated BP: Blood pressure will be monitored at home to determine if medication changes need to be made.          Physical Exam:   VS:  BP (!) 144/68   Pulse 71   Ht 5\' 5"  (1.651 m)   Wt 191 lb (86.6 kg)   LMP  (LMP Unknown)   SpO2 98%   BMI 31.78 kg/m    Wt Readings from Last 3 Encounters:  02/11/24 191 lb (86.6 kg)  12/05/23 192 lb 3.2 oz (87.2 kg)  11/08/23 194 lb 4 oz (88.1 kg)    GEN: Well nourished, well developed in no acute distress NECK: No JVD; No carotid bruits CARDIAC: RRR, no murmurs, rubs, gallops RESPIRATORY:  Clear to auscultation without rales, wheezing or rhonchi  ABDOMEN: Soft, non-tender, non-distended EXTREMITIES:  No edema; No deformity   ASSESSMENT AND PLAN: .   CAD - s/p DES to pRCA; continue aspirin  81 mg daily, continue Plavix  75 mg daily, continue nitroglycerin  as needed, continue Crestor  10 mg daily. Stable with no anginal symptoms. No indication for ischemic evaluation.     Hypertension-blood pressure is elevated today, will have her keep a blood pressure log at home, currently she is on lisinopril  10 mg daily.  If it is persistently, can consider increasing her lisinopril  or possibly switching her to an ARB.  Shortness of breath/DOE-has been ongoing for some time, apparently she had an echo in 2017 however cannot review the results.  Will repeat echocardiogram for any contributory causes.  Will check proBNP.  Elevated LP(a) with dyslipidemia-currently appears she is on Crestor  10 mg each evening.  Will repeat FLP and LFTs.  Caregiver role strain-she is the caregiver for her disabled adult daughter, she voices frustration at the lack of help.  I am not sure if our social worker will be able to add any thing at this point but I will make a referral to see if there is anything they would be able to help her with her resources to guide her towards.        Dispo: Blood pressure log for 2 weeks, echocardiogram, labs for above, follow-up in 3 months with Dr. Lafayette Pierre.  Signed, Terrance Ferretti, NP

## 2024-02-11 ENCOUNTER — Ambulatory Visit: Attending: Cardiology | Admitting: Cardiology

## 2024-02-11 ENCOUNTER — Telehealth: Payer: Self-pay | Admitting: Licensed Clinical Social Worker

## 2024-02-11 ENCOUNTER — Encounter: Payer: Self-pay | Admitting: Cardiology

## 2024-02-11 VITALS — BP 144/68 | HR 71 | Ht 65.0 in | Wt 191.0 lb

## 2024-02-11 DIAGNOSIS — Z638 Other specified problems related to primary support group: Secondary | ICD-10-CM | POA: Diagnosis not present

## 2024-02-11 DIAGNOSIS — I10 Essential (primary) hypertension: Secondary | ICD-10-CM | POA: Diagnosis not present

## 2024-02-11 DIAGNOSIS — E782 Mixed hyperlipidemia: Secondary | ICD-10-CM | POA: Diagnosis not present

## 2024-02-11 DIAGNOSIS — R0609 Other forms of dyspnea: Secondary | ICD-10-CM

## 2024-02-11 DIAGNOSIS — I2510 Atherosclerotic heart disease of native coronary artery without angina pectoris: Secondary | ICD-10-CM

## 2024-02-11 DIAGNOSIS — E7841 Elevated Lipoprotein(a): Secondary | ICD-10-CM

## 2024-02-11 MED ORDER — CLOPIDOGREL BISULFATE 75 MG PO TABS: 75.0000 mg | ORAL_TABLET | Freq: Every day | ORAL | 1 refills | Status: DC

## 2024-02-11 NOTE — Progress Notes (Unsigned)
 Heart and Vascular Care Navigation  02/11/2024  Frances Newman 05/30/1946 [id number]  Reason for Referral: caregiver support/community resources Patient is participating in a Managed Medicaid Plan: No, [facility] Medicare  Engaged with patient by telephone for initial visit for Heart and Vascular Care Coordination.                                                                                                   Assessment:                                     LCSW was able to reach pt via telephone at [phone]. Introduced self, role, reason for call. Confirmed home address, PCP, and emergency contacts are her son and sister. Pt shares that she resides with her adult daughter who has care needs. She has assistance with her daughter and usually she goes out to a "program" during the day. Pt interested in assistance in the home to help her with tasks around the house due to lower mobility etc. She also is interested in transportation and community resources.   Pt and pt daughter receive social security. They receive SNAP but a minimal amount. No current concerns with utilities or housing costs. Gets medications without issues for access and affordability but does share that she sometimes is a few pills short. Since she uses a pill packing system through the pharmacy I encouraged her to try and keep track of which medications those are and if possible to lock medications up as she has several controlled medications on the list. Pt agreeable.   We discussed personal care services without full Medicaid benefits are usually private pay. She was encouraged to call her Member Services and check through, also recommended that she speak with [employer] about possible personal care options they may also have lists for that are more local than our information.   I will also connect her with information about caregiver support and additional transportation options in the community.   No additional  questions at this time but will provide f/u.   HRT/VAS Care Coordination     Patients Home Cardiology Office [facility]   Outpatient Care Team Social Worker   Social Worker Name: Nathen Balder, [place], [phone]   Living arrangements for the past 2 months Single Family Home   Lives with: Adult Children   Patient Current Insurance Coverage Managed Medicare   Patient Has Concern With Paying Medical Bills No   Does Patient Have Prescription Coverage? Yes       Social History:                                                                             SDOH Screenings  Food Insecurity: Food Insecurity Present (02/11/2024)  Housing: Low Risk  (02/11/2024)  Transportation Needs: No Transportation Needs (10/24/2023)  Utilities: Not At Risk (02/11/2024)  Depression (PHQ2-9): Medium Risk (12/05/2023)  Financial Resource Strain: Low Risk  (02/11/2024)  Social Connections: Moderately Isolated (10/24/2023)  Tobacco Use: Medium Risk (02/11/2024)  Health Literacy: Adequate Health Literacy (02/11/2024)    SDOH Interventions: Financial Resources:  Financial Strain Interventions: Intervention Not Indicated  Food Insecurity:  Food Insecurity Interventions: Intervention Not Indicated  Housing Insecurity:  Housing Interventions: Intervention Not Indicated  Transportation:    Pt sent transportation resources- likely has transportation through [employer] coverage     Other Care Navigation Interventions:     Provided Pharmacy assistance resources  Pt denied any issues obtaining her medications or affording them- states sometimes she comes up short on meds. Uses pill packs, encouraged her to keep track of what medications she is coming up short on and to lock up medications in between doses if possible as several controlled medications on her list  Patient Referred to: [facility], [employer], [employer] list   Follow-up plan:   LCSW mailed pt information about [employer] and discussed these likely are private pay if she does not currently have full Medicaid, information about caregiver support groups and in home care lists through [employer]. Pt also sent information about [employer] and her managed Medicare transportation to use as needed. Will f/u to ensure pt has received these resources and answer any additional questions they may have.

## 2024-02-11 NOTE — Patient Instructions (Signed)
 Medication Instructions:  Your physician recommends that you continue on your current medications as directed. Please refer to the Current Medication list given to you today.  *If you need a refill on your cardiac medications before your next appointment, please call your pharmacy*  Lab Work: Your physician recommends that you return for lab work in:   Labs today: CMP, CBC, Pro BNP, Lipid  If you have labs (blood work) drawn today and your tests are completely normal, you will receive your results only by: MyChart Message (if you have MyChart) OR A paper copy in the mail If you have any lab test that is abnormal or we need to change your treatment, we will call you to review the results.  Testing/Procedures: Your physician has requested that you have an echocardiogram. Echocardiography is a painless test that uses sound waves to create images of your heart. It provides your doctor with information about the size and shape of your heart and how well your heart's chambers and valves are working. This procedure takes approximately one hour. There are no restrictions for this procedure. Please do NOT wear cologne, perfume, aftershave, or lotions (deodorant is allowed). Please arrive 15 minutes prior to your appointment time.  Please note: We ask at that you not bring children with you during ultrasound (echo/ vascular) testing. Due to room size and safety concerns, children are not allowed in the ultrasound rooms during exams. Our front office staff cannot provide observation of children in our lobby area while testing is being conducted. An adult accompanying a patient to their appointment will only be allowed in the ultrasound room at the discretion of the ultrasound technician under special circumstances. We apologize for any inconvenience.   Follow-Up: At [facility], you and your health needs are our priority.  As part of our continuing mission to provide you with exceptional heart  care, our providers are all part of one team.  This team includes your primary Cardiologist (physician) and Advanced Practice Providers or APPs (Physician Assistants and Nurse Practitioners) who all work together to provide you with the care you need, when you need it.  Your next appointment:   3 month(s)  Provider:   Hillis Lu, MD    We recommend signing up for the patient portal called "MyChart".  Sign up information is provided on this After Visit Summary.  MyChart is used to connect with patients for Virtual Visits (Telemedicine).  Patients are able to view lab/test results, encounter notes, upcoming appointments, etc.  Non-urgent messages can be sent to your provider as well.   To learn more about what you can do with MyChart, go to [email or web address].   Other Instructions Check and record blood pressure daily for 2 weeks and then drop off the list at the office.

## 2024-02-12 ENCOUNTER — Ambulatory Visit: Attending: Cardiology

## 2024-02-12 ENCOUNTER — Telehealth: Payer: Self-pay | Admitting: Emergency Medicine

## 2024-02-12 DIAGNOSIS — I10 Essential (primary) hypertension: Secondary | ICD-10-CM | POA: Diagnosis not present

## 2024-02-12 DIAGNOSIS — E782 Mixed hyperlipidemia: Secondary | ICD-10-CM | POA: Diagnosis not present

## 2024-02-12 DIAGNOSIS — E7841 Elevated Lipoprotein(a): Secondary | ICD-10-CM | POA: Diagnosis not present

## 2024-02-12 DIAGNOSIS — I2510 Atherosclerotic heart disease of native coronary artery without angina pectoris: Secondary | ICD-10-CM

## 2024-02-12 DIAGNOSIS — R0609 Other forms of dyspnea: Secondary | ICD-10-CM

## 2024-02-12 LAB — ECHOCARDIOGRAM COMPLETE
Area-P 1/2: 2.95 cm2
MV M vel: 4.85 m/s
MV Peak grad: 94.1 mmHg
S' Lateral: 3 cm

## 2024-02-12 LAB — CBC
Hematocrit: 34.3 % (ref 34.0–46.6)
Hemoglobin: 10.8 g/dL — ABNORMAL LOW (ref 11.1–15.9)
MCH: 28.6 pg (ref 26.6–33.0)
MCHC: 31.5 g/dL (ref 31.5–35.7)
MCV: 91 fL (ref 79–97)
Platelets: 209 x10E3/uL (ref 150–450)
RBC: 3.78 x10E6/uL (ref 3.77–5.28)
RDW: 13 % (ref 11.7–15.4)
WBC: 8.3 x10E3/uL (ref 3.4–10.8)

## 2024-02-12 LAB — LIPID PANEL
Chol/HDL Ratio: 2.5 ratio (ref 0.0–4.4)
Cholesterol, Total: 151 mg/dL (ref 100–199)
HDL: 61 mg/dL
LDL Chol Calc (NIH): 74 mg/dL (ref 0–99)
Triglycerides: 86 mg/dL (ref 0–149)
VLDL Cholesterol Cal: 16 mg/dL (ref 5–40)

## 2024-02-12 LAB — COMPREHENSIVE METABOLIC PANEL WITH GFR
ALT: 14 IU/L (ref 0–32)
AST: 21 IU/L (ref 0–40)
Albumin: 4.2 g/dL (ref 3.8–4.8)
Alkaline Phosphatase: 68 IU/L (ref 44–121)
BUN/Creatinine Ratio: 13 (ref 12–28)
BUN: 12 mg/dL (ref 8–27)
Bilirubin Total: <0.2 mg/dL (ref 0.0–1.2)
CO2: 20 mmol/L (ref 20–29)
Calcium: 9.3 mg/dL (ref 8.7–10.3)
Chloride: 112 mmol/L — ABNORMAL HIGH (ref 96–106)
Creatinine, Ser: 0.96 mg/dL (ref 0.57–1.00)
Globulin, Total: 2.1 g/dL (ref 1.5–4.5)
Glucose: 105 mg/dL — ABNORMAL HIGH (ref 70–99)
Potassium: 4.5 mmol/L (ref 3.5–5.2)
Sodium: 147 mmol/L — ABNORMAL HIGH (ref 134–144)
Total Protein: 6.3 g/dL (ref 6.0–8.5)
eGFR: 62 mL/min/1.73

## 2024-02-12 LAB — PRO B NATRIURETIC PEPTIDE: NT-Pro BNP: 921 pg/mL — ABNORMAL HIGH (ref 0–738)

## 2024-02-12 MED ORDER — FUROSEMIDE 20 MG PO TABS: 20.0000 mg | ORAL_TABLET | ORAL | 3 refills | Status: AC

## 2024-02-12 NOTE — Telephone Encounter (Signed)
 Called and spoke to patient.   Reviewed lab results with patient as per [facility] note. Reviewed recommendations with patient as per [facility] note. Rx sent to pharmacy

## 2024-02-12 NOTE — Telephone Encounter (Signed)
-----   Message from Terrance Ferretti sent at 02/12/2024 11:19 AM EDT ----- Her proBNP is slightly elevated, will start her on Lasix 20 mg p.o. x 3 days.  Then she can take as needed for abdominal swelling.   Repeat BMET in 1 week.  Other lab work is stable.

## 2024-02-18 ENCOUNTER — Ambulatory Visit: Payer: Self-pay | Admitting: Emergency Medicine

## 2024-02-18 DIAGNOSIS — M62572 Muscle wasting and atrophy, not elsewhere classified, left ankle and foot: Secondary | ICD-10-CM | POA: Diagnosis not present

## 2024-02-18 DIAGNOSIS — M62571 Muscle wasting and atrophy, not elsewhere classified, right ankle and foot: Secondary | ICD-10-CM | POA: Diagnosis not present

## 2024-02-18 NOTE — Telephone Encounter (Signed)
 Called and spoke to patient.  Reviewed echo results with patient as per [facility] note. Patient reports that she is feeling better. She reports her shortness of breath is not as bad and she is not as swollen. Informed patient I would let Pattricia Bores know. She verbalized understanding and had no further questions.

## 2024-02-18 NOTE — Telephone Encounter (Signed)
-----   Message from Terrance Ferretti sent at 02/14/2024  8:05 AM EDT ----- Echocardiogram shows that your heart is squeezing normally, slightly stiff when it relaxes however this is not abnormal as we age and would not cause any symptoms.  Her valves look okay for age-related changes.  There was a slight elevation in one of the pressures around her heart, which correlates with the elevated blood test that we collected and subsequently started her on Lasix  for her.  Did she feel any better after she started her Lasix ?

## 2024-02-19 ENCOUNTER — Other Ambulatory Visit: Payer: Self-pay | Admitting: Internal Medicine

## 2024-02-19 DIAGNOSIS — I10 Essential (primary) hypertension: Secondary | ICD-10-CM | POA: Diagnosis not present

## 2024-02-19 DIAGNOSIS — R0609 Other forms of dyspnea: Secondary | ICD-10-CM | POA: Diagnosis not present

## 2024-02-20 ENCOUNTER — Ambulatory Visit: Payer: Self-pay | Admitting: Cardiology

## 2024-02-20 DIAGNOSIS — H2511 Age-related nuclear cataract, right eye: Secondary | ICD-10-CM | POA: Diagnosis not present

## 2024-02-20 DIAGNOSIS — H25041 Posterior subcapsular polar age-related cataract, right eye: Secondary | ICD-10-CM | POA: Diagnosis not present

## 2024-02-20 LAB — BASIC METABOLIC PANEL WITH GFR
BUN/Creatinine Ratio: 25 (ref 12–28)
BUN: 29 mg/dL — ABNORMAL HIGH (ref 8–27)
CO2: 20 mmol/L (ref 20–29)
Calcium: 9.3 mg/dL (ref 8.7–10.3)
Chloride: 109 mmol/L — ABNORMAL HIGH (ref 96–106)
Creatinine, Ser: 1.16 mg/dL — ABNORMAL HIGH (ref 0.57–1.00)
Glucose: 109 mg/dL — ABNORMAL HIGH (ref 70–99)
Potassium: 4.4 mmol/L (ref 3.5–5.2)
Sodium: 145 mmol/L — ABNORMAL HIGH (ref 134–144)
eGFR: 49 mL/min/1.73 — ABNORMAL LOW

## 2024-02-24 NOTE — Telephone Encounter (Signed)
-----   Message from Terrance Ferretti sent at 02/20/2024  7:47 AM EDT ----- Repeat lab work is stable.

## 2024-02-24 NOTE — Telephone Encounter (Signed)
 Called patient and discussed results per Jennifer's note. Patient verbalized understanding and had no questions at this time.

## 2024-02-26 ENCOUNTER — Encounter: Payer: Self-pay | Admitting: Internal Medicine

## 2024-02-26 ENCOUNTER — Ambulatory Visit: Admitting: Internal Medicine

## 2024-02-26 VITALS — BP 142/82 | HR 87 | Temp 97.0°F | Resp 18 | Ht 65.0 in | Wt 187.0 lb

## 2024-02-26 DIAGNOSIS — E119 Type 2 diabetes mellitus without complications: Secondary | ICD-10-CM | POA: Diagnosis not present

## 2024-02-26 DIAGNOSIS — G894 Chronic pain syndrome: Secondary | ICD-10-CM | POA: Diagnosis not present

## 2024-02-26 DIAGNOSIS — I2510 Atherosclerotic heart disease of native coronary artery without angina pectoris: Secondary | ICD-10-CM | POA: Diagnosis not present

## 2024-02-26 DIAGNOSIS — E782 Mixed hyperlipidemia: Secondary | ICD-10-CM

## 2024-02-26 MED ORDER — XTAMPZA ER 9 MG PO C12A: 1.0000 | EXTENDED_RELEASE_CAPSULE | Freq: Two times a day (BID) | ORAL | 0 refills | Status: DC

## 2024-02-26 NOTE — Assessment & Plan Note (Signed)
 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  She is s/p LHC with stent placement in 10/2023. She is on DAPT for 6 months. She denies any angina pain today. Continue on her statin but her LDL is not at goal with a history of CAD/MI and diabetes where her LDL <55.  We will increase her crestor .  She will continue her plavix  and aspirin  as well as her metoprolol . Her ECHO was reviewed.

## 2024-02-26 NOTE — Assessment & Plan Note (Signed)
 She is asking if she can have another ESI or other procedure.  She does not want to go to [facility] and is requesting to be seen by Skeet Duke for evaluation of her back/hip.   We will refill her Xtampza  today.  I reviewed her [place] CSR/PDMP.
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
## 2024-02-26 NOTE — Assessment & Plan Note (Signed)
 She controls this with diet.  We will continue to monitor with HgBa1c.

## 2024-02-26 NOTE — Assessment & Plan Note (Signed)
 We will make changes to her crestor  as described above.

## 2024-02-27 ENCOUNTER — Ambulatory Visit: Payer: 59 | Admitting: Internal Medicine

## 2024-02-28 ENCOUNTER — Other Ambulatory Visit: Payer: Self-pay

## 2024-02-28 MED ORDER — ROSUVASTATIN CALCIUM 20 MG PO TABS: 20.0000 mg | ORAL_TABLET | Freq: Every day | ORAL | 2 refills | Status: DC

## 2024-03-10 ENCOUNTER — Other Ambulatory Visit: Payer: Self-pay | Admitting: Internal Medicine

## 2024-03-10 MED ORDER — ALPRAZOLAM 0.25 MG PO TABS: 0.2500 mg | ORAL_TABLET | Freq: Every day | ORAL | 2 refills | Status: DC | PRN

## 2024-03-11 ENCOUNTER — Other Ambulatory Visit: Payer: Self-pay | Admitting: Internal Medicine

## 2024-03-13 ENCOUNTER — Telehealth: Payer: Self-pay

## 2024-03-13 NOTE — Telephone Encounter (Signed)
 Received refeill request from [facility] for azelastine  spray sent in denial patient last seen by Dr Jolayne Natter 03/01/2023 needs visit

## 2024-03-18 ENCOUNTER — Other Ambulatory Visit: Payer: Self-pay | Admitting: Internal Medicine

## 2024-03-20 DIAGNOSIS — M62572 Muscle wasting and atrophy, not elsewhere classified, left ankle and foot: Secondary | ICD-10-CM | POA: Diagnosis not present

## 2024-03-20 DIAGNOSIS — M62571 Muscle wasting and atrophy, not elsewhere classified, right ankle and foot: Secondary | ICD-10-CM | POA: Diagnosis not present

## 2024-03-28 ENCOUNTER — Other Ambulatory Visit: Payer: Self-pay | Admitting: Internal Medicine

## 2024-03-28 DIAGNOSIS — F411 Generalized anxiety disorder: Secondary | ICD-10-CM

## 2024-04-08 ENCOUNTER — Other Ambulatory Visit: Payer: Self-pay | Admitting: Internal Medicine

## 2024-04-14 ENCOUNTER — Ambulatory Visit: Payer: PRIVATE HEALTH INSURANCE | Admitting: Podiatry

## 2024-04-19 DIAGNOSIS — M62572 Muscle wasting and atrophy, not elsewhere classified, left ankle and foot: Secondary | ICD-10-CM | POA: Diagnosis not present

## 2024-04-19 DIAGNOSIS — M62571 Muscle wasting and atrophy, not elsewhere classified, right ankle and foot: Secondary | ICD-10-CM | POA: Diagnosis not present

## 2024-04-22 ENCOUNTER — Other Ambulatory Visit: Payer: Self-pay | Admitting: Internal Medicine

## 2024-04-28 ENCOUNTER — Other Ambulatory Visit: Payer: Self-pay | Admitting: Internal Medicine

## 2024-04-28 ENCOUNTER — Other Ambulatory Visit: Payer: Self-pay

## 2024-04-28 DIAGNOSIS — F411 Generalized anxiety disorder: Secondary | ICD-10-CM

## 2024-04-28 MED ORDER — HYDROXYZINE HCL 10 MG PO TABS: 10.0000 mg | ORAL_TABLET | Freq: Three times a day (TID) | ORAL | 0 refills | Status: DC | PRN

## 2024-04-28 NOTE — Progress Notes (Signed)
 Rx refill

## 2024-05-06 ENCOUNTER — Other Ambulatory Visit: Payer: Self-pay | Admitting: Internal Medicine

## 2024-05-13 ENCOUNTER — Other Ambulatory Visit: Payer: Self-pay | Admitting: Internal Medicine

## 2024-05-14 ENCOUNTER — Ambulatory Visit: Attending: Cardiology | Admitting: Cardiology

## 2024-05-14 ENCOUNTER — Other Ambulatory Visit: Payer: Self-pay | Admitting: Internal Medicine

## 2024-05-14 ENCOUNTER — Encounter: Payer: Self-pay | Admitting: Cardiology

## 2024-05-14 VITALS — BP 120/70 | Ht 65.0 in | Wt 186.0 lb

## 2024-05-14 DIAGNOSIS — E66811 Obesity, class 1: Secondary | ICD-10-CM

## 2024-05-14 DIAGNOSIS — I10 Essential (primary) hypertension: Secondary | ICD-10-CM

## 2024-05-14 DIAGNOSIS — I2510 Atherosclerotic heart disease of native coronary artery without angina pectoris: Secondary | ICD-10-CM

## 2024-05-14 DIAGNOSIS — E782 Mixed hyperlipidemia: Secondary | ICD-10-CM | POA: Diagnosis not present

## 2024-05-14 DIAGNOSIS — Z6831 Body mass index (BMI) 31.0-31.9, adult: Secondary | ICD-10-CM

## 2024-05-14 DIAGNOSIS — I700 Atherosclerosis of aorta: Secondary | ICD-10-CM | POA: Diagnosis not present

## 2024-05-14 DIAGNOSIS — E6609 Other obesity due to excess calories: Secondary | ICD-10-CM

## 2024-05-14 NOTE — Patient Instructions (Signed)
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
## 2024-05-20 ENCOUNTER — Other Ambulatory Visit: Payer: Self-pay | Admitting: Internal Medicine

## 2024-05-20 DIAGNOSIS — M62571 Muscle wasting and atrophy, not elsewhere classified, right ankle and foot: Secondary | ICD-10-CM | POA: Diagnosis not present

## 2024-05-20 DIAGNOSIS — M62572 Muscle wasting and atrophy, not elsewhere classified, left ankle and foot: Secondary | ICD-10-CM | POA: Diagnosis not present

## 2024-05-20 MED ORDER — ALPRAZOLAM 0.25 MG PO TABS: 0.2500 mg | ORAL_TABLET | Freq: Every day | ORAL | 2 refills | Status: DC | PRN

## 2024-05-26 ENCOUNTER — Encounter: Payer: Self-pay | Admitting: Internal Medicine

## 2024-05-26 ENCOUNTER — Ambulatory Visit: Admitting: Internal Medicine

## 2024-05-26 VITALS — BP 150/82 | HR 78 | Temp 97.0°F | Resp 18 | Ht 65.0 in | Wt 188.4 lb

## 2024-05-26 DIAGNOSIS — E119 Type 2 diabetes mellitus without complications: Secondary | ICD-10-CM

## 2024-05-26 DIAGNOSIS — E782 Mixed hyperlipidemia: Secondary | ICD-10-CM

## 2024-05-26 DIAGNOSIS — M65341 Trigger finger, right ring finger: Secondary | ICD-10-CM | POA: Diagnosis not present

## 2024-05-26 DIAGNOSIS — G894 Chronic pain syndrome: Secondary | ICD-10-CM

## 2024-05-26 MED ORDER — XTAMPZA ER 9 MG PO C12A
1.0000 | EXTENDED_RELEASE_CAPSULE | Freq: Two times a day (BID) | ORAL | 0 refills | Status: DC
Start: 2024-07-27 — End: 2024-08-24

## 2024-05-26 MED ORDER — XTAMPZA ER 9 MG PO C12A: 1.0000 | EXTENDED_RELEASE_CAPSULE | Freq: Two times a day (BID) | ORAL | 0 refills | Status: AC

## 2024-05-26 MED ORDER — XTAMPZA ER 9 MG PO C12A: 1.0000 | EXTENDED_RELEASE_CAPSULE | Freq: Two times a day (BID) | ORAL | 0 refills | Status: DC

## 2024-05-26 NOTE — Assessment & Plan Note (Signed)
 Her goal LDL <55 with her history of diabetes and CAD.  We will check her FLP today.
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
## 2024-05-26 NOTE — Assessment & Plan Note (Signed)
 We will check a HgBa1c on her today.  She is not on any medications for her diabetes.

## 2024-05-26 NOTE — Assessment & Plan Note (Signed)
 We will refer her to ortho for her back pain and for trigger finger on her right hand.

## 2024-05-26 NOTE — Assessment & Plan Note (Signed)
 She will remain on her Xtampza  at this time.  Her [place] CSR/PDMP was reviewed.  She will cotninue on gabapentin  and can use tyleneol as needed for pain.

## 2024-05-27 ENCOUNTER — Other Ambulatory Visit: Payer: Self-pay | Admitting: Internal Medicine

## 2024-05-28 ENCOUNTER — Ambulatory Visit

## 2024-05-29 ENCOUNTER — Ambulatory Visit

## 2024-05-29 ENCOUNTER — Other Ambulatory Visit: Payer: Self-pay | Admitting: Internal Medicine

## 2024-05-29 DIAGNOSIS — E782 Mixed hyperlipidemia: Secondary | ICD-10-CM | POA: Diagnosis not present

## 2024-05-29 DIAGNOSIS — F411 Generalized anxiety disorder: Secondary | ICD-10-CM

## 2024-05-29 DIAGNOSIS — E119 Type 2 diabetes mellitus without complications: Secondary | ICD-10-CM | POA: Diagnosis not present

## 2024-05-30 LAB — HEMOGLOBIN A1C
Est. average glucose Bld gHb Est-mCnc: 123 mg/dL
Hgb A1c MFr Bld: 5.9 % — ABNORMAL HIGH (ref 4.8–5.6)

## 2024-05-30 LAB — LIPID PANEL
Chol/HDL Ratio: 2.5 ratio (ref 0.0–4.4)
Cholesterol, Total: 165 mg/dL (ref 100–199)
HDL: 66 mg/dL (ref >39)
LDL Chol Calc (NIH): 76 mg/dL (ref 0–99)
Triglycerides: 133 mg/dL (ref 0–149)
VLDL Cholesterol Cal: 23 mg/dL (ref 5–40)

## 2024-05-30 LAB — CMP14 + ANION GAP
ALT: 11 IU/L (ref 0–32)
AST: 17 IU/L (ref 0–40)
Albumin: 4.5 g/dL (ref 3.8–4.8)
Alkaline Phosphatase: 93 IU/L (ref 44–121)
Anion Gap: 16 mmol/L (ref 10.0–18.0)
BUN/Creatinine Ratio: 14 (ref 12–28)
BUN: 18 mg/dL (ref 8–27)
Bilirubin Total: <0.2 mg/dL (ref 0.0–1.2)
CO2: 24 mmol/L (ref 20–29)
Calcium: 9.6 mg/dL (ref 8.7–10.3)
Chloride: 103 mmol/L (ref 96–106)
Creatinine, Ser: 1.27 mg/dL — ABNORMAL HIGH (ref 0.57–1.00)
Globulin, Total: 2.4 g/dL (ref 1.5–4.5)
Glucose: 93 mg/dL (ref 70–99)
Potassium: 5.3 mmol/L — ABNORMAL HIGH (ref 3.5–5.2)
Sodium: 143 mmol/L (ref 134–144)
Total Protein: 6.9 g/dL (ref 6.0–8.5)
eGFR: 44 mL/min/1.73 — ABNORMAL LOW (ref >59)

## 2024-06-04 DIAGNOSIS — H34832 Tributary (branch) retinal vein occlusion, left eye, with macular edema: Secondary | ICD-10-CM | POA: Diagnosis not present

## 2024-06-20 DIAGNOSIS — M62571 Muscle wasting and atrophy, not elsewhere classified, right ankle and foot: Secondary | ICD-10-CM | POA: Diagnosis not present

## 2024-06-22 ENCOUNTER — Ambulatory Visit: Payer: Self-pay

## 2024-06-22 NOTE — Progress Notes (Signed)
 Patient called.  Patient aware.  I have called and informed the patient  She has some CKD.  Her cholesterol is not at goal.  Increase her crestor  to 20mg  at bedtime. SABRA  Pt aware.

## 2024-06-24 ENCOUNTER — Other Ambulatory Visit: Payer: Self-pay | Admitting: Internal Medicine

## 2024-06-24 ENCOUNTER — Other Ambulatory Visit: Payer: Self-pay | Admitting: Cardiology

## 2024-06-24 DIAGNOSIS — F411 Generalized anxiety disorder: Secondary | ICD-10-CM

## 2024-07-02 DIAGNOSIS — H34832 Tributary (branch) retinal vein occlusion, left eye, with macular edema: Secondary | ICD-10-CM | POA: Diagnosis not present

## 2024-07-08 ENCOUNTER — Other Ambulatory Visit: Payer: Self-pay | Admitting: Internal Medicine

## 2024-07-08 ENCOUNTER — Other Ambulatory Visit: Payer: Self-pay

## 2024-07-08 MED ORDER — LISINOPRIL 10 MG PO TABS: 10.0000 mg | ORAL_TABLET | Freq: Every day | ORAL | 0 refills | Status: DC

## 2024-07-13 ENCOUNTER — Other Ambulatory Visit: Payer: Self-pay | Admitting: Internal Medicine

## 2024-07-17 ENCOUNTER — Other Ambulatory Visit: Payer: Self-pay | Admitting: Internal Medicine

## 2024-07-17 MED ORDER — ALPRAZOLAM 0.25 MG PO TABS: 0.2500 mg | ORAL_TABLET | Freq: Every day | ORAL | 2 refills | Status: DC | PRN

## 2024-07-20 DIAGNOSIS — M62572 Muscle wasting and atrophy, not elsewhere classified, left ankle and foot: Secondary | ICD-10-CM | POA: Diagnosis not present

## 2024-07-20 DIAGNOSIS — M62571 Muscle wasting and atrophy, not elsewhere classified, right ankle and foot: Secondary | ICD-10-CM | POA: Diagnosis not present

## 2024-07-22 ENCOUNTER — Other Ambulatory Visit: Payer: Self-pay | Admitting: Internal Medicine

## 2024-07-22 DIAGNOSIS — F411 Generalized anxiety disorder: Secondary | ICD-10-CM

## 2024-07-24 DIAGNOSIS — Z23 Encounter for immunization: Secondary | ICD-10-CM | POA: Diagnosis not present

## 2024-07-30 DIAGNOSIS — H34832 Tributary (branch) retinal vein occlusion, left eye, with macular edema: Secondary | ICD-10-CM | POA: Diagnosis not present

## 2024-08-13 ENCOUNTER — Ambulatory Visit: Admitting: Internal Medicine

## 2024-08-17 ENCOUNTER — Other Ambulatory Visit: Payer: Self-pay | Admitting: Internal Medicine

## 2024-08-18 ENCOUNTER — Ambulatory Visit: Payer: PRIVATE HEALTH INSURANCE | Admitting: Podiatry

## 2024-08-21 ENCOUNTER — Encounter: Admitting: Internal Medicine

## 2024-08-24 ENCOUNTER — Other Ambulatory Visit: Payer: Self-pay | Admitting: Internal Medicine

## 2024-08-24 ENCOUNTER — Ambulatory Visit: Admitting: Internal Medicine

## 2024-08-24 ENCOUNTER — Encounter: Payer: Self-pay | Admitting: Internal Medicine

## 2024-08-24 VITALS — BP 130/88 | HR 90 | Temp 97.3°F | Resp 18 | Ht 65.0 in | Wt 188.4 lb

## 2024-08-24 DIAGNOSIS — M2041 Other hammer toe(s) (acquired), right foot: Secondary | ICD-10-CM

## 2024-08-24 DIAGNOSIS — I739 Peripheral vascular disease, unspecified: Secondary | ICD-10-CM

## 2024-08-24 DIAGNOSIS — J302 Other seasonal allergic rhinitis: Secondary | ICD-10-CM

## 2024-08-24 DIAGNOSIS — E119 Type 2 diabetes mellitus without complications: Secondary | ICD-10-CM

## 2024-08-24 DIAGNOSIS — N1831 Chronic kidney disease, stage 3a: Secondary | ICD-10-CM | POA: Insufficient documentation

## 2024-08-24 DIAGNOSIS — E66811 Obesity, class 1: Secondary | ICD-10-CM

## 2024-08-24 DIAGNOSIS — G894 Chronic pain syndrome: Secondary | ICD-10-CM

## 2024-08-24 DIAGNOSIS — I700 Atherosclerosis of aorta: Secondary | ICD-10-CM

## 2024-08-24 DIAGNOSIS — Z6831 Body mass index (BMI) 31.0-31.9, adult: Secondary | ICD-10-CM

## 2024-08-24 DIAGNOSIS — H9190 Unspecified hearing loss, unspecified ear: Secondary | ICD-10-CM

## 2024-08-24 DIAGNOSIS — K5903 Drug induced constipation: Secondary | ICD-10-CM

## 2024-08-24 DIAGNOSIS — K219 Gastro-esophageal reflux disease without esophagitis: Secondary | ICD-10-CM

## 2024-08-24 DIAGNOSIS — I10 Essential (primary) hypertension: Secondary | ICD-10-CM

## 2024-08-24 DIAGNOSIS — E782 Mixed hyperlipidemia: Secondary | ICD-10-CM

## 2024-08-24 DIAGNOSIS — J4531 Mild persistent asthma with (acute) exacerbation: Secondary | ICD-10-CM

## 2024-08-24 DIAGNOSIS — Z1231 Encounter for screening mammogram for malignant neoplasm of breast: Secondary | ICD-10-CM

## 2024-08-24 DIAGNOSIS — E1169 Type 2 diabetes mellitus with other specified complication: Secondary | ICD-10-CM

## 2024-08-24 DIAGNOSIS — M65341 Trigger finger, right ring finger: Secondary | ICD-10-CM

## 2024-08-24 DIAGNOSIS — M2042 Other hammer toe(s) (acquired), left foot: Secondary | ICD-10-CM

## 2024-08-24 DIAGNOSIS — F411 Generalized anxiety disorder: Secondary | ICD-10-CM

## 2024-08-24 DIAGNOSIS — I2510 Atherosclerotic heart disease of native coronary artery without angina pectoris: Secondary | ICD-10-CM

## 2024-08-24 MED ORDER — XTAMPZA ER 9 MG PO C12A: 1.0000 | EXTENDED_RELEASE_CAPSULE | Freq: Two times a day (BID) | ORAL | 0 refills | Status: AC

## 2024-08-24 NOTE — Assessment & Plan Note (Signed)
 Plan as above.

## 2024-08-24 NOTE — Assessment & Plan Note (Signed)
 We will continue risk factor modification at this time.  She is on crestor  20mg  and ASA.

## 2024-08-24 NOTE — Assessment & Plan Note (Signed)
 She will continue with her current inhalers.

## 2024-08-24 NOTE — Assessment & Plan Note (Signed)
 Her CAD is stable and she denies any angina at this time.  We will continue with BB, statin and ASA.

## 2024-08-24 NOTE — Assessment & Plan Note (Signed)
 There is no change in her chronic pain.  Contionue on her Xtampza  and she can use tylenol  as needed.

## 2024-08-24 NOTE — Progress Notes (Signed)
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 in the morning and at bedtime. 60 capsule 0   rosuvastatin  (CRESTOR ) 20 MG tablet TAKE 1 TABLET BY MOUTH DAILY. 30 tablet 0   Vitamin D , Ergocalciferol , (DRISDOL ) 1.25 MG (50000 UNIT) CAPS capsule TAKE 1 CAPSULE BY MOUTH EVERY 7 DAYS. 12 capsule 0   YUPELRI 175 MCG/3ML nebulizer solution Take 175 mcg by nebulization as needed (shortness of breath).     gabapentin  (NEURONTIN ) 300 MG capsule TAKE 1 CAPSULE BY MOUTH 2 TIMES DAILY. (Patient taking differently: Take 300 mg by mouth at bedtime.) 60 capsule 1   No current facility-administered medications for this visit.    Allergies Levaquin [levofloxacin in d5w], Zithromax [azithromycin], and Prednisone    Social History Social History   Tobacco Use   Smoking status: Former    Types: Cigarettes   Smokeless tobacco: Never   Tobacco comments:    quit 35 years  Substance Use Topics   Alcohol use: No    Alcohol/week: 0.0 standard drinks of alcohol     Review of Systems Review of Systems  Constitutional:  Negative for chills, fever and malaise/fatigue.  Eyes:  Negative for blurred vision and double vision.  Respiratory:  Negative for cough, hemoptysis, shortness of breath and wheezing.   Cardiovascular:  Negative for chest pain, palpitations and leg swelling.  Gastrointestinal:   Negative for abdominal pain, blood in stool, constipation, diarrhea, heartburn, melena, nausea and vomiting.  Genitourinary:  Negative for frequency and hematuria.  Musculoskeletal:  Negative for myalgias.  Skin:  Negative for itching and rash.  Neurological:  Negative for dizziness, weakness and headaches.  Endo/Heme/Allergies:  Negative for polydipsia.     Physical Exam:      Body mass index is 31.35 kg/m. BP 130/88 (BP Location: Left Arm, Patient Position: Sitting, Cuff Size: Normal)   Pulse 90   Temp (!) 97.3 F (36.3 C)   Resp 18   Ht 5' 5 (1.651 m)   Wt 188 lb 6 oz (85.4 kg)   LMP  (LMP Unknown)   SpO2 93%   BMI 31.35 kg/m   Physical Exam Constitutional:      Appearance: Normal appearance. She is not ill-appearing.  HENT:     Head: Normocephalic  and atraumatic.     Right Ear: Tympanic membrane, ear canal and external ear normal.     Left Ear: Tympanic membrane, ear canal and external ear normal.     Nose: Nose normal. No congestion or rhinorrhea.     Mouth/Throat:     Mouth: Mucous membranes are moist.     Pharynx: Oropharynx is clear. No posterior oropharyngeal erythema.  Eyes:     General: No scleral icterus.    Conjunctiva/sclera: Conjunctivae normal.     Pupils: Pupils are equal, round, and reactive to light.  Neck:     Thyroid : No thyromegaly.     Vascular: No carotid bruit.  Cardiovascular:     Rate and Rhythm: Normal rate and regular rhythm.     Pulses: Normal pulses.     Heart sounds: Normal heart sounds. No murmur heard.    No friction rub. No gallop.  Pulmonary:     Effort: Pulmonary effort is normal. No respiratory distress.     Breath sounds: Normal breath sounds. No wheezing, rhonchi or rales.  Abdominal:     General: Abdomen is flat. Bowel sounds are normal. There is no distension.     Palpations: Abdomen is soft.     Tenderness: There is no abdominal tenderness.  Musculoskeletal:     Cervical back: Normal range of motion. No tenderness.      Right lower leg: No edema.     Left lower leg: No edema.     Comments: No clubbing or cyanosis  Lymphadenopathy:     Cervical: No cervical adenopathy.  Skin:    General: Skin is warm and dry.     Findings: No rash.  Neurological:     General: No focal deficit present.     Mental Status: She is alert and oriented to person, place, and time.     Comments: CN II-XII grossly intact  Psychiatric:        Mood and Affect: Mood normal.        Behavior: Behavior normal.      Assessment:      Diabetes mellitus without complication (HCC)  Stage 3a chronic kidney disease (HCC)  Chronic pain syndrome  GAD (generalized anxiety disorder)  Mixed hyperlipidemia  Trigger ring finger of right hand  Coronary artery disease involving native coronary artery of native heart without angina pectoris  Essential hypertension  Mild persistent asthma with acute exacerbation  Gastroesophageal reflux disease, unspecified whether esophagitis present  PAD (peripheral artery disease)  Seasonal allergies  Aortic atherosclerosis  Therapeutic opioid induced constipation  Hammertoes of both feet  BMI 31.0-31.9,adult  Class 1 obesity due to excess calories with serious comorbidity and body mass index (BMI) of 31.0 to 31.9 in adult  Type 2 diabetes mellitus with other specified complication, without long-term current use of insulin (HCC)  Visit for screening mammogram  Hearing loss, unspecified hearing loss type, unspecified laterality    Plan:     During the course of the visit the patient was educated and counseled about appropriate screening and preventive services including:   Pneumococcal vaccine  Influenza vaccine Screening mammography Colorectal cancer screening Advanced directives: discussed  Diet review for nutrition referral? Yes ____  Not Indicated _X___   Patient Instructions (the written plan) was given to the patient.  PAD (peripheral artery disease) We will  continue risk factor modification at this time.  She is on crestor  20mg  and ASA.  Essential hypertension Her BP is currently controlled.  Continue her current meds.  Coronary artery disease involving native coronary artery of native heart without angina pectoris Her CAD is stable and she denies any angina at this time.  We will continue with BB, statin and ASA.  Aortic atherosclerosis Plan as above.  Mild persistent asthma She will continue with her current inhalers.  Therapeutic opioid induced constipation I want her to use fiber and she can use senna and miralax as needed.  Gastroesophageal reflux disease This is controlled.  T2DM (type 2 diabetes mellitus) (HCC) Her diabetic eye exam was normal and her diabetic foot exam today was normal.  We will recheck his Diabetic labs today.  Trigger ring finger of right hand We will refer her to ortho for her trigger finger.  Hammertoes of both feet She will decide when to go back to podiatry.  Stage 3a chronic kidney disease (HCC) I want her to stop the BC powders and avoid NSAIDS.  We will recheck her kidney function and do a urine MACR today.  Seasonal allergies Continue her current meds.  Mixed hyperlipidemia Her goal LDL <55 with her history of DM and CAD.  GAD (generalized anxiety disorder) Continue lexapro  and xanax  as needed.  Class 1 obesity due to excess calories with serious comorbidity and body mass index (BMI) of 31.0 to 31.9 in adult I want her to eat healthy, exericse more and lose weight.  Chronic pain syndrome There is no change in her chronic pain.  Contionue on her Xtampza  and she can use tylenol  as needed.  BMI 31.0-31.9,adult Plan as above.   Prevention Health maintenance discussed.  We will arrange for a mammogram.  We will obtain some yearly labs.  Medicare Attestation I have personally reviewed: The patient's medical and social history Their use of alcohol, tobacco or illicit drugs Their  current medications and supplements The patient's functional ability including ADLs,fall risks, home safety risks, cognitive, and hearing and visual impairment Diet and physical activities Evidence for depression or mood disorders  The patient's weight, height, and BMI have been recorded in the chart.  I have made referrals, counseling, and provided education to the patient based on review of the above and I have provided the patient with a written personalized care plan for preventive services.     Selinda Fleeta Finger, MD   08/24/2024

## 2024-08-24 NOTE — Assessment & Plan Note (Signed)
 She will decide when to go back to podiatry.

## 2024-08-24 NOTE — Assessment & Plan Note (Signed)
 Her goal LDL <55 with her history of DM and CAD.

## 2024-08-24 NOTE — Assessment & Plan Note (Signed)
Continue her current meds. 

## 2024-08-24 NOTE — Assessment & Plan Note (Signed)
 I want her to eat healthy, exericse more and lose weight.

## 2024-08-24 NOTE — Assessment & Plan Note (Signed)
 Her BP is currently controlled.  Continue her current meds.

## 2024-08-24 NOTE — Assessment & Plan Note (Signed)
 Continue lexapro  and xanax  as needed.

## 2024-08-24 NOTE — Assessment & Plan Note (Signed)
 I want her to use fiber and she can use senna and miralax as needed.

## 2024-08-24 NOTE — Assessment & Plan Note (Signed)
 We will refer her to ortho for her trigger finger.

## 2024-08-24 NOTE — Assessment & Plan Note (Signed)
 I want her to stop the BC powders and avoid NSAIDS.  We will recheck her kidney function and do a urine MACR today.

## 2024-08-24 NOTE — Assessment & Plan Note (Signed)
This is controlled.

## 2024-08-24 NOTE — Assessment & Plan Note (Signed)
 Her diabetic eye exam was normal and her diabetic foot exam today was normal.  We will recheck his Diabetic labs today.

## 2024-08-25 ENCOUNTER — Ambulatory Visit: Admitting: Internal Medicine

## 2024-08-25 DIAGNOSIS — E119 Type 2 diabetes mellitus without complications: Secondary | ICD-10-CM | POA: Diagnosis not present

## 2024-08-25 DIAGNOSIS — N1831 Chronic kidney disease, stage 3a: Secondary | ICD-10-CM | POA: Diagnosis not present

## 2024-08-25 LAB — MICROALBUMIN / CREATININE URINE RATIO
Creatinine, Urine: 173.7 mg/dL
Microalb/Creat Ratio: 18 mg/g{creat} (ref 0–29)
Microalbumin, Urine: 31.9 ug/mL

## 2024-08-25 LAB — HEMOGLOBIN A1C

## 2024-08-25 NOTE — Addendum Note (Signed)
 Addended by: LENETTA LACKS on: 08/25/2024 10:43 AM   Modules accepted: Orders

## 2024-08-25 NOTE — Progress Notes (Signed)
 Nurse Visit Blood Draw  Collected labs from 08/25/2024

## 2024-08-26 ENCOUNTER — Other Ambulatory Visit: Payer: Self-pay | Admitting: Internal Medicine

## 2024-08-26 DIAGNOSIS — F411 Generalized anxiety disorder: Secondary | ICD-10-CM

## 2024-08-26 LAB — CBC WITH DIFFERENTIAL/PLATELET
Basophils Absolute: 0.1 x10E3/uL (ref 0.0–0.2)
Basos: 1 %
EOS (ABSOLUTE): 0.3 x10E3/uL (ref 0.0–0.4)
Eos: 3 %
Hematocrit: 35 % (ref 34.0–46.6)
Hemoglobin: 10.6 g/dL — ABNORMAL LOW (ref 11.1–15.9)
Immature Grans (Abs): 0 x10E3/uL (ref 0.0–0.1)
Immature Granulocytes: 0 %
Lymphocytes Absolute: 2.3 x10E3/uL (ref 0.7–3.1)
Lymphs: 27 %
MCH: 28.1 pg (ref 26.6–33.0)
MCHC: 30.3 g/dL — ABNORMAL LOW (ref 31.5–35.7)
MCV: 93 fL (ref 79–97)
Monocytes Absolute: 0.6 x10E3/uL (ref 0.1–0.9)
Monocytes: 8 %
Neutrophils Absolute: 5.2 x10E3/uL (ref 1.4–7.0)
Neutrophils: 61 %
Platelets: 230 x10E3/uL (ref 150–450)
RBC: 3.77 x10E6/uL (ref 3.77–5.28)
RDW: 12.8 % (ref 11.7–15.4)
WBC: 8.5 x10E3/uL (ref 3.4–10.8)

## 2024-08-26 LAB — LIPID PANEL
Chol/HDL Ratio: 2.9 ratio (ref 0.0–4.4)
Cholesterol, Total: 153 mg/dL (ref 100–199)
HDL: 53 mg/dL (ref >39)
LDL Chol Calc (NIH): 78 mg/dL (ref 0–99)
Triglycerides: 125 mg/dL (ref 0–149)
VLDL Cholesterol Cal: 22 mg/dL (ref 5–40)

## 2024-08-26 LAB — HEMOGLOBIN A1C
Est. average glucose Bld gHb Est-mCnc: 120 mg/dL
Hgb A1c MFr Bld: 5.8 % — ABNORMAL HIGH (ref 4.8–5.6)

## 2024-08-26 LAB — CMP14 + ANION GAP
ALT: 10 IU/L (ref 0–32)
AST: 18 IU/L (ref 0–40)
Albumin: 4.3 g/dL (ref 3.8–4.8)
Alkaline Phosphatase: 83 IU/L (ref 49–135)
Anion Gap: 14 mmol/L (ref 10.0–18.0)
BUN/Creatinine Ratio: 19 (ref 12–28)
BUN: 25 mg/dL (ref 8–27)
Bilirubin Total: <0.2 mg/dL (ref 0.0–1.2)
CO2: 21 mmol/L (ref 20–29)
Calcium: 9 mg/dL (ref 8.7–10.3)
Chloride: 106 mmol/L (ref 96–106)
Creatinine, Ser: 1.29 mg/dL — ABNORMAL HIGH (ref 0.57–1.00)
Globulin, Total: 2 g/dL (ref 1.5–4.5)
Glucose: 127 mg/dL — ABNORMAL HIGH (ref 70–99)
Potassium: 5.5 mmol/L — ABNORMAL HIGH (ref 3.5–5.2)
Sodium: 141 mmol/L (ref 134–144)
Total Protein: 6.3 g/dL (ref 6.0–8.5)
eGFR: 43 mL/min/1.73 — ABNORMAL LOW (ref >59)

## 2024-08-26 LAB — TSH: TSH: 2.59 u[IU]/mL (ref 0.450–4.500)

## 2024-08-28 ENCOUNTER — Other Ambulatory Visit: Payer: Self-pay | Admitting: Internal Medicine

## 2024-08-28 DIAGNOSIS — F411 Generalized anxiety disorder: Secondary | ICD-10-CM

## 2024-08-31 ENCOUNTER — Ambulatory Visit: Payer: Self-pay

## 2024-08-31 NOTE — Progress Notes (Signed)
 Patient called.  Patient aware.  Her labs look good but her K+ level is elevated.  She needs a repeat BMP this week.

## 2024-09-01 ENCOUNTER — Ambulatory Visit: Admitting: Internal Medicine

## 2024-09-01 ENCOUNTER — Ambulatory Visit

## 2024-09-01 DIAGNOSIS — E875 Hyperkalemia: Secondary | ICD-10-CM | POA: Diagnosis not present

## 2024-09-01 NOTE — Progress Notes (Signed)
 Nurse Visit Lab Draw BMP

## 2024-09-02 ENCOUNTER — Ambulatory Visit: Payer: Self-pay

## 2024-09-02 LAB — BASIC METABOLIC PANEL WITH GFR
BUN/Creatinine Ratio: 18 (ref 12–28)
BUN: 22 mg/dL (ref 8–27)
CO2: 18 mmol/L — ABNORMAL LOW (ref 20–29)
Calcium: 9.5 mg/dL (ref 8.7–10.3)
Chloride: 107 mmol/L — ABNORMAL HIGH (ref 96–106)
Creatinine, Ser: 1.22 mg/dL — ABNORMAL HIGH (ref 0.57–1.00)
Glucose: 164 mg/dL — ABNORMAL HIGH (ref 70–99)
Potassium: 4.5 mmol/L (ref 3.5–5.2)
Sodium: 141 mmol/L (ref 134–144)
eGFR: 46 mL/min/1.73 — ABNORMAL LOW (ref >59)

## 2024-09-02 NOTE — Progress Notes (Signed)
Patient called.  Left message for patient to call back.

## 2024-09-02 NOTE — Progress Notes (Signed)
Patient called.  Patient aware.    Labs are good

## 2024-09-10 ENCOUNTER — Other Ambulatory Visit: Payer: Self-pay | Admitting: Internal Medicine

## 2024-09-15 ENCOUNTER — Other Ambulatory Visit: Payer: Self-pay | Admitting: Internal Medicine

## 2024-09-15 MED ORDER — ALPRAZOLAM 0.25 MG PO TABS: 0.2500 mg | ORAL_TABLET | Freq: Every day | ORAL | 2 refills | Status: AC | PRN

## 2024-09-15 MED ORDER — XTAMPZA ER 9 MG PO C12A: 1.0000 | EXTENDED_RELEASE_CAPSULE | Freq: Two times a day (BID) | ORAL | 0 refills | Status: AC

## 2024-09-23 ENCOUNTER — Other Ambulatory Visit: Payer: Self-pay | Admitting: Internal Medicine

## 2024-09-28 ENCOUNTER — Other Ambulatory Visit: Payer: Self-pay | Admitting: Internal Medicine

## 2024-10-05 ENCOUNTER — Other Ambulatory Visit: Payer: Self-pay | Admitting: Internal Medicine

## 2024-10-15 ENCOUNTER — Other Ambulatory Visit: Payer: Self-pay | Admitting: Internal Medicine

## 2024-10-21 ENCOUNTER — Other Ambulatory Visit: Payer: Self-pay | Admitting: Internal Medicine

## 2024-11-23 ENCOUNTER — Ambulatory Visit: Admitting: Internal Medicine
